# Patient Record
Sex: Female | Born: 1965 | Race: White | Hispanic: No | Marital: Single | Smoking: Never smoker
Health system: Southern US, Community
[De-identification: ages and names within clinical notes are randomized; demographics above are authoritative.]

## PROBLEM LIST (undated history)

## (undated) DIAGNOSIS — S83207A Unspecified tear of unspecified meniscus, current injury, left knee, initial encounter: Secondary | ICD-10-CM

## (undated) DIAGNOSIS — R2689 Other abnormalities of gait and mobility: Secondary | ICD-10-CM

## (undated) DIAGNOSIS — K219 Gastro-esophageal reflux disease without esophagitis: Secondary | ICD-10-CM

## (undated) DIAGNOSIS — K5792 Diverticulitis of intestine, part unspecified, without perforation or abscess without bleeding: Secondary | ICD-10-CM

## (undated) DIAGNOSIS — T4145XA Adverse effect of unspecified anesthetic, initial encounter: Secondary | ICD-10-CM

## (undated) DIAGNOSIS — Z8709 Personal history of other diseases of the respiratory system: Secondary | ICD-10-CM

## (undated) DIAGNOSIS — I10 Essential (primary) hypertension: Secondary | ICD-10-CM

## (undated) DIAGNOSIS — G5602 Carpal tunnel syndrome, left upper limb: Secondary | ICD-10-CM

## (undated) DIAGNOSIS — N182 Chronic kidney disease, stage 2 (mild): Secondary | ICD-10-CM

## (undated) DIAGNOSIS — E785 Hyperlipidemia, unspecified: Secondary | ICD-10-CM

## (undated) DIAGNOSIS — M1990 Unspecified osteoarthritis, unspecified site: Secondary | ICD-10-CM

## (undated) DIAGNOSIS — G43909 Migraine, unspecified, not intractable, without status migrainosus: Secondary | ICD-10-CM

## (undated) DIAGNOSIS — Z87442 Personal history of urinary calculi: Secondary | ICD-10-CM

## (undated) DIAGNOSIS — Z9889 Other specified postprocedural states: Secondary | ICD-10-CM

## (undated) DIAGNOSIS — Z86718 Personal history of other venous thrombosis and embolism: Secondary | ICD-10-CM

## (undated) DIAGNOSIS — R112 Nausea with vomiting, unspecified: Secondary | ICD-10-CM

## (undated) DIAGNOSIS — Z8719 Personal history of other diseases of the digestive system: Secondary | ICD-10-CM

## (undated) DIAGNOSIS — K439 Ventral hernia without obstruction or gangrene: Secondary | ICD-10-CM

## (undated) DIAGNOSIS — S83512A Sprain of anterior cruciate ligament of left knee, initial encounter: Secondary | ICD-10-CM

## (undated) HISTORY — PX: CARPAL TUNNEL RELEASE: SHX101

## (undated) HISTORY — PX: KNEE ARTHROSCOPY: SUR90

## (undated) HISTORY — PX: TUBAL LIGATION: SHX77

## (undated) HISTORY — PX: ESOPHAGOGASTRODUODENOSCOPY ENDOSCOPY: SHX5814

## (undated) HISTORY — DX: Hyperlipidemia, unspecified: E78.5

## (undated) HISTORY — PX: COLONOSCOPY: SHX174

## (undated) HISTORY — DX: Carpal tunnel syndrome, left upper limb: G56.02

## (undated) HISTORY — PX: ABDOMINOPLASTY: SUR9

## (undated) HISTORY — DX: Essential (primary) hypertension: I10

---
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
## 1995-06-26 HISTORY — PX: TUBAL LIGATION: SHX77

## 2006-07-13 ENCOUNTER — Ambulatory Visit: Payer: Self-pay

## 2006-08-22 ENCOUNTER — Ambulatory Visit: Payer: Self-pay | Admitting: Orthopaedic Surgery

## 2011-07-09 ENCOUNTER — Encounter (HOSPITAL_COMMUNITY): Payer: Self-pay | Admitting: Emergency Medicine

## 2011-07-09 ENCOUNTER — Emergency Department (HOSPITAL_COMMUNITY)
Admission: EM | Admit: 2011-07-09 | Discharge: 2011-07-09 | Disposition: A | Discharge status: Home / Self Care | Payer: BC Managed Care – PPO | Attending: Emergency Medicine | Admitting: Emergency Medicine

## 2011-07-09 ENCOUNTER — Emergency Department (HOSPITAL_COMMUNITY): Payer: BC Managed Care – PPO

## 2011-07-09 DIAGNOSIS — N200 Calculus of kidney: Secondary | ICD-10-CM | POA: Insufficient documentation

## 2011-07-09 DIAGNOSIS — K5732 Diverticulitis of large intestine without perforation or abscess without bleeding: Secondary | ICD-10-CM | POA: Insufficient documentation

## 2011-07-09 DIAGNOSIS — M545 Low back pain, unspecified: Secondary | ICD-10-CM | POA: Insufficient documentation

## 2011-07-09 DIAGNOSIS — R109 Unspecified abdominal pain: Secondary | ICD-10-CM | POA: Insufficient documentation

## 2011-07-09 DIAGNOSIS — K5792 Diverticulitis of intestine, part unspecified, without perforation or abscess without bleeding: Secondary | ICD-10-CM

## 2011-07-09 LAB — COMPREHENSIVE METABOLIC PANEL
ALT: 21 U/L (ref 0–35)
Albumin: 3.6 g/dL (ref 3.5–5.2)
Alkaline Phosphatase: 96 U/L (ref 39–117)
Glucose, Bld: 92 mg/dL (ref 70–99)
Potassium: 4 mEq/L (ref 3.5–5.1)
Sodium: 136 mEq/L (ref 135–145)
Total Protein: 7.8 g/dL (ref 6.0–8.3)

## 2011-07-09 LAB — DIFFERENTIAL
Basophils Relative: 0 % (ref 0–1)
Eosinophils Absolute: 0.2 10*3/uL (ref 0.0–0.7)
Lymphs Abs: 1.8 10*3/uL (ref 0.7–4.0)
Neutro Abs: 5.9 10*3/uL (ref 1.7–7.7)
Neutrophils Relative %: 68 % (ref 43–77)

## 2011-07-09 LAB — URINALYSIS, ROUTINE W REFLEX MICROSCOPIC
Bilirubin Urine: NEGATIVE
Leukocytes, UA: NEGATIVE
Nitrite: NEGATIVE
Specific Gravity, Urine: 1.026 (ref 1.005–1.030)
Urobilinogen, UA: 0.2 mg/dL (ref 0.0–1.0)

## 2011-07-09 LAB — CBC
MCH: 32.1 pg (ref 26.0–34.0)
Platelets: 236 10*3/uL (ref 150–400)
RBC: 4.43 MIL/uL (ref 3.87–5.11)
WBC: 8.7 10*3/uL (ref 4.0–10.5)

## 2011-07-09 LAB — WET PREP, GENITAL: Trich, Wet Prep: NONE SEEN

## 2011-07-09 LAB — URINE MICROSCOPIC-ADD ON

## 2011-07-09 LAB — PREGNANCY, URINE: Preg Test, Ur: NEGATIVE

## 2011-07-09 MED ORDER — METRONIDAZOLE 500 MG PO TABS: 500.0000 mg | ORAL_TABLET | Freq: Three times a day (TID) | ORAL | Status: AC

## 2011-07-09 MED ORDER — CIPROFLOXACIN HCL 500 MG PO TABS
500.0000 mg | ORAL_TABLET | Freq: Once | ORAL | Status: AC
  Administered 2011-07-09: 500 mg via ORAL
  Filled 2011-07-09: qty 1

## 2011-07-09 MED ORDER — CIPROFLOXACIN HCL 500 MG PO TABS: 500.0000 mg | ORAL_TABLET | Freq: Two times a day (BID) | ORAL | Status: AC

## 2011-07-09 MED ORDER — ONDANSETRON HCL 4 MG/2ML IJ SOLN
INTRAMUSCULAR | Status: AC
  Filled 2011-07-09: qty 2

## 2011-07-09 MED ORDER — IOHEXOL 300 MG/ML  SOLN
80.0000 mL | Freq: Once | INTRAMUSCULAR | Status: AC | PRN
  Administered 2011-07-09: 80 mL via INTRAVENOUS

## 2011-07-09 MED ORDER — MORPHINE SULFATE 4 MG/ML IJ SOLN
6.0000 mg | Freq: Once | INTRAMUSCULAR | Status: AC
  Administered 2011-07-09: 6 mg via INTRAVENOUS
  Filled 2011-07-09: qty 1
  Filled 2011-07-09: qty 2

## 2011-07-09 MED ORDER — MORPHINE SULFATE 4 MG/ML IJ SOLN
4.0000 mg | Freq: Once | INTRAMUSCULAR | Status: AC
  Administered 2011-07-09: 4 mg via INTRAVENOUS
  Filled 2011-07-09: qty 1

## 2011-07-09 MED ORDER — ONDANSETRON HCL 4 MG/2ML IJ SOLN
4.0000 mg | Freq: Once | INTRAMUSCULAR | Status: AC
  Administered 2011-07-09: 4 mg via INTRAVENOUS

## 2011-07-09 MED ORDER — HYDROCODONE-ACETAMINOPHEN 5-500 MG PO TABS: 1.0000 | ORAL_TABLET | Freq: Four times a day (QID) | ORAL | Status: AC | PRN

## 2011-07-09 MED ORDER — METRONIDAZOLE 500 MG PO TABS
500.0000 mg | ORAL_TABLET | Freq: Once | ORAL | Status: AC
  Administered 2011-07-09: 500 mg via ORAL
  Filled 2011-07-09: qty 1

## 2011-07-09 NOTE — ED Provider Notes (Signed)
History     CSN: [id number]  Arrival date & time 07/09/11  4098   First MD Initiated Contact with Patient 07/09/11 0845      Chief Complaint  Patient presents with  . Abdominal Pain    (Consider location/radiation/quality/duration/timing/severity/associated sxs/prior treatment) Patient is a 46 y.o. female presenting with abdominal pain. The history is provided by the patient.  Abdominal Pain The primary symptoms of the illness include abdominal pain. The primary symptoms of the illness do not include fever, shortness of breath, nausea, vomiting, diarrhea, dysuria, vaginal discharge or vaginal bleeding. The current episode started 2 days ago. The onset of the illness was gradual. The problem has not changed since onset. The patient states that she believes she is currently not pregnant. The patient has not had a change in bowel habit. Symptoms associated with the illness do not include chills, anorexia, heartburn, constipation, urgency, hematuria, frequency or back pain.  Pt states pain is mainly in the right lower abdomen but radiates all over and to the lower back. Denies fever, chills, nausea, vomiting, vaginal discharge, bleeding. Normal urination and bowel movements, last was yesterday. Good appetite.   History reviewed. No pertinent past medical history.  History reviewed. No pertinent past surgical history.  No family history on file.  History  Substance Use Topics  . Smoking status: Never Smoker   . Smokeless tobacco: Not on file  . Alcohol Use: Yes    OB History    Grav Para Term Preterm Abortions TAB SAB Ect Mult Living                  Review of Systems  Constitutional: Negative for fever and chills.  HENT: Negative.   Eyes: Negative.   Respiratory: Negative for shortness of breath.   Gastrointestinal: Positive for abdominal pain. Negative for heartburn, nausea, vomiting, diarrhea, constipation and anorexia.  Genitourinary: Negative for dysuria, urgency,  frequency, hematuria, vaginal bleeding and vaginal discharge.  Musculoskeletal: Negative for back pain.  Neurological: Negative.   Psychiatric/Behavioral: Negative.     Allergies  Review of patient's allergies indicates no known allergies.  Home Medications  No current outpatient prescriptions on file.  BP 148/72  Pulse 88  Temp(Src) 98.3 F (36.8 C) (Oral)  Resp 18  SpO2 100%  Physical Exam  Nursing note and vitals reviewed. Constitutional: She is oriented to person, place, and time. She appears well-developed and well-nourished.  HENT:  Head: Normocephalic.  Neck: Neck supple.  Cardiovascular: Normal rate, regular rhythm and normal heart sounds.   Pulmonary/Chest: Effort normal and breath sounds normal. No respiratory distress.  Abdominal: Soft. Bowel sounds are normal. She exhibits no distension. There is tenderness. There is no rebound.       RLQ tenderness, guarding, no rebound tenderness.   Genitourinary:       Normal vaginal canal with no discharge, normal appearing cervix. No CMT, no adnexal tenderness or masses. No CVA tenderness  Musculoskeletal: Normal range of motion.  Neurological: She is alert and oriented to person, place, and time.  Skin: Skin is warm and dry.  Psychiatric: She has a normal mood and affect.    ED Course  Procedures (including critical care time)  Pt with RLQ abdominal pain. Unremarkable pelvic exam, no adnexal tenderness or masses. Unremarkable labs. Will get CT abd pelvis for further evaluation.  Results for orders placed during the hospital encounter of 07/09/11  URINALYSIS, ROUTINE W REFLEX MICROSCOPIC      Component Value Range   Color, Urine  YELLOW  YELLOW    APPearance CLEAR  CLEAR    Specific Gravity, Urine 1.026  1.005 - 1.030    pH 6.5  5.0 - 8.0    Glucose, UA NEGATIVE  NEGATIVE (mg/dL)   Hgb urine dipstick NEGATIVE  NEGATIVE    Bilirubin Urine NEGATIVE  NEGATIVE    Ketones, ur NEGATIVE  NEGATIVE (mg/dL)   Protein, ur 30  (*) NEGATIVE (mg/dL)   Urobilinogen, UA 0.2  0.0 - 1.0 (mg/dL)   Nitrite NEGATIVE  NEGATIVE    Leukocytes, UA NEGATIVE  NEGATIVE   CBC      Component Value Range   WBC 8.7  4.0 - 10.5 (K/uL)   RBC 4.43  3.87 - 5.11 (MIL/uL)   Hemoglobin 14.2  12.0 - 15.0 (g/dL)   HCT 16.1  09.6 - 04.5 (%)   MCV 90.7  78.0 - 100.0 (fL)   MCH 32.1  26.0 - 34.0 (pg)   MCHC 35.3  30.0 - 36.0 (g/dL)   RDW 40.9  81.1 - 91.4 (%)   Platelets 236  150 - 400 (K/uL)  DIFFERENTIAL      Component Value Range   Neutrophils Relative 68  43 - 77 (%)   Neutro Abs 5.9  1.7 - 7.7 (K/uL)   Lymphocytes Relative 21  12 - 46 (%)   Lymphs Abs 1.8  0.7 - 4.0 (K/uL)   Monocytes Relative 9  3 - 12 (%)   Monocytes Absolute 0.8  0.1 - 1.0 (K/uL)   Eosinophils Relative 2  0 - 5 (%)   Eosinophils Absolute 0.2  0.0 - 0.7 (K/uL)   Basophils Relative 0  0 - 1 (%)   Basophils Absolute 0.0  0.0 - 0.1 (K/uL)  COMPREHENSIVE METABOLIC PANEL      Component Value Range   Sodium 136  135 - 145 (mEq/L)   Potassium 4.0  3.5 - 5.1 (mEq/L)   Chloride 102  96 - 112 (mEq/L)   CO2 23  19 - 32 (mEq/L)   Glucose, Bld 92  70 - 99 (mg/dL)   BUN 9  6 - 23 (mg/dL)   Creatinine, Ser 7.82  0.50 - 1.10 (mg/dL)   Calcium 9.3  8.4 - 95.6 (mg/dL)   Total Protein 7.8  6.0 - 8.3 (g/dL)   Albumin 3.6  3.5 - 5.2 (g/dL)   AST 18  0 - 37 (U/L)   ALT 21  0 - 35 (U/L)   Alkaline Phosphatase 96  39 - 117 (U/L)   Total Bilirubin 0.6  0.3 - 1.2 (mg/dL)   GFR calc non Af Amer 82 (*) >90 (mL/min)   GFR calc Af Amer >90  >90 (mL/min)  WET PREP, GENITAL      Component Value Range   Yeast, Wet Prep NONE SEEN  NONE SEEN    Trich, Wet Prep NONE SEEN  NONE SEEN    Clue Cells, Wet Prep RARE (*) NONE SEEN    WBC, Wet Prep HPF POC RARE (*) NONE SEEN   PREGNANCY, URINE      Component Value Range   Preg Test, Ur NEGATIVE    URINE MICROSCOPIC-ADD ON      Component Value Range   Squamous Epithelial / LPF FEW (*) RARE    WBC, UA 0-2  <3 (WBC/hpf)   Bacteria, UA  RARE  RARE    Urine-Other MUCOUS PRESENT     Ct Abdomen Pelvis W Contrast  07/09/2011  *RADIOLOGY REPORT*  Clinical Data:  Abdominal pain.  History kidney stones.  CT ABDOMEN AND PELVIS WITH CONTRAST  Technique:  Multidetector CT imaging of the abdomen and pelvis was performed following the standard protocol during bolus administration of intravenous contrast.  Contrast: 80mL OMNIPAQUE IOHEXOL 300 MG/ML IV SOLN  Comparison: None.  Findings: The patient has diverticulitis of the distal sigmoid portion of the colon deep in the right side of the pelvis posterior to the right ovary and the uterus.  There is pericolonic soft tissue inflammation with the edema of the mucosa of the colon.  No abscess or free air.  Mild hepatomegaly consistent with hepatic steatosis.  Spleen, pancreas, right adrenal gland, and kidneys are normal. 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  No adenopathy.  Terminal ileum and appendix are normal.  Scattered diverticula throughout the colon, most numerous in the sigmoid region.  No significant osseous abnormality.  IMPRESSION:  1.  Acute diverticulitis of the distal sigmoid colon low in the right side of the pelvis with no abscess or perforation. 2.  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  Original Report Authenticated By: Gwynn Burly, M.D.    Pt has normal vs. Diverticulitis on CT abd.  Labs normal. Will treat outpatient. First set of antibiotics given in ED. VS stable during the visit. Pt agrees with the plan.  MDM          Lottie Mussel, PA 07/09/11 1504

## 2011-07-09 NOTE — ED Notes (Signed)
Pt has had tubal ligation  

## 2011-07-09 NOTE — ED Notes (Signed)
Rt lower abd pain x 36 hours that goes thru to back hx of kidney stones

## 2011-07-09 NOTE — ED Notes (Signed)
Pt undressed and in gown. Pulse ox and bp cuff on.

## 2011-07-10 NOTE — ED Provider Notes (Signed)
Medical screening examination/treatment/procedure(s) were performed by non-physician practitioner and as supervising physician I was immediately available for consultation/collaboration.    Allysson Rinehimer R Demarr Kluever, MD 07/10/11 0706 

## 2013-04-20 ENCOUNTER — Emergency Department (HOSPITAL_COMMUNITY)
Admission: EM | Admit: 2013-04-20 | Discharge: 2013-04-20 | Disposition: A | Discharge status: Home / Self Care | Payer: BC Managed Care – PPO | Attending: Emergency Medicine | Admitting: Emergency Medicine

## 2013-04-20 ENCOUNTER — Emergency Department (HOSPITAL_COMMUNITY): Payer: BC Managed Care – PPO

## 2013-04-20 ENCOUNTER — Encounter (HOSPITAL_COMMUNITY): Payer: Self-pay | Admitting: Emergency Medicine

## 2013-04-20 DIAGNOSIS — Z3202 Encounter for pregnancy test, result negative: Secondary | ICD-10-CM | POA: Insufficient documentation

## 2013-04-20 DIAGNOSIS — Z79899 Other long term (current) drug therapy: Secondary | ICD-10-CM | POA: Insufficient documentation

## 2013-04-20 DIAGNOSIS — R1032 Left lower quadrant pain: Secondary | ICD-10-CM | POA: Insufficient documentation

## 2013-04-20 DIAGNOSIS — Z87442 Personal history of urinary calculi: Secondary | ICD-10-CM | POA: Insufficient documentation

## 2013-04-20 DIAGNOSIS — M545 Low back pain, unspecified: Secondary | ICD-10-CM | POA: Insufficient documentation

## 2013-04-20 DIAGNOSIS — R109 Unspecified abdominal pain: Secondary | ICD-10-CM

## 2013-04-20 DIAGNOSIS — Z8719 Personal history of other diseases of the digestive system: Secondary | ICD-10-CM | POA: Insufficient documentation

## 2013-04-20 DIAGNOSIS — R112 Nausea with vomiting, unspecified: Secondary | ICD-10-CM | POA: Insufficient documentation

## 2013-04-20 HISTORY — DX: Diverticulitis of intestine, part unspecified, without perforation or abscess without bleeding: K57.92

## 2013-04-20 LAB — COMPREHENSIVE METABOLIC PANEL WITH GFR
ALT: 27 U/L (ref 0–35)
AST: 23 U/L (ref 0–37)
Albumin: 4.1 g/dL (ref 3.5–5.2)
Alkaline Phosphatase: 109 U/L (ref 39–117)
BUN: 14 mg/dL (ref 6–23)
CO2: 23 meq/L (ref 19–32)
Calcium: 9.4 mg/dL (ref 8.4–10.5)
Chloride: 102 meq/L (ref 96–112)
Creatinine, Ser: 0.81 mg/dL (ref 0.50–1.10)
GFR calc Af Amer: >90 mL/min
GFR calc non Af Amer: 86 mL/min — ABNORMAL LOW
Glucose, Bld: 98 mg/dL (ref 70–99)
Potassium: 3.8 meq/L (ref 3.5–5.1)
Sodium: 137 meq/L (ref 135–145)
Total Bilirubin: 0.3 mg/dL (ref 0.3–1.2)
Total Protein: 7.8 g/dL (ref 6.0–8.3)

## 2013-04-20 LAB — URINALYSIS, ROUTINE W REFLEX MICROSCOPIC
Bilirubin Urine: NEGATIVE
Glucose, UA: NEGATIVE mg/dL
Hgb urine dipstick: NEGATIVE
Ketones, ur: NEGATIVE mg/dL
Leukocytes, UA: NEGATIVE
Nitrite: NEGATIVE
Protein, ur: NEGATIVE mg/dL
Specific Gravity, Urine: 1.019 (ref 1.005–1.030)
Urobilinogen, UA: 0.2 mg/dL (ref 0.0–1.0)
pH: 6 (ref 5.0–8.0)

## 2013-04-20 LAB — CBC WITH DIFFERENTIAL/PLATELET
Basophils Absolute: 0 10*3/uL (ref 0.0–0.1)
Basophils Relative: 1 % (ref 0–1)
Eosinophils Relative: 3 % (ref 0–5)
HCT: 41.3 % (ref 36.0–46.0)
MCHC: 35.1 g/dL (ref 30.0–36.0)
MCV: 93.7 fL (ref 78.0–100.0)
Monocytes Absolute: 0.6 10*3/uL (ref 0.1–1.0)
RDW: 12.2 % (ref 11.5–15.5)

## 2013-04-20 LAB — PREGNANCY, URINE: Preg Test, Ur: NEGATIVE

## 2013-04-20 MED ORDER — ONDANSETRON 4 MG PO TBDP: 4.0000 mg | ORAL_TABLET | Freq: Three times a day (TID) | ORAL | Status: DC | PRN

## 2013-04-20 MED ORDER — HYDROCODONE-ACETAMINOPHEN 5-325 MG PO TABS: 1.0000 | ORAL_TABLET | Freq: Four times a day (QID) | ORAL | Status: DC | PRN

## 2013-04-20 MED ORDER — MORPHINE SULFATE 4 MG/ML IJ SOLN
4.0000 mg | Freq: Once | INTRAMUSCULAR | Status: AC
  Administered 2013-04-20: 4 mg via INTRAVENOUS
  Filled 2013-04-20: qty 1

## 2013-04-20 MED ORDER — ONDANSETRON HCL 4 MG/2ML IJ SOLN
4.0000 mg | Freq: Once | INTRAMUSCULAR | Status: AC
  Administered 2013-04-20: 4 mg via INTRAVENOUS
  Filled 2013-04-20: qty 2

## 2013-04-20 MED ORDER — SODIUM CHLORIDE 0.9 % IV BOLUS (SEPSIS)
1000.0000 mL | Freq: Once | INTRAVENOUS | Status: AC
  Administered 2013-04-20: 1000 mL via INTRAVENOUS

## 2013-04-20 NOTE — ED Notes (Signed)
Patient states that she is having abdominal pain on L and R side.  Patient states it hurts more on the L lower abdomen.   Patient states she has been having pain x 1 week.   Patient states also has low back pain all the way across.   Patient states "I think it may be my diverticulitis".   Patient denies any problems with bowel movements.   Patient states that she is having urinary frequency.   Patient advised she has had some nausea and vomiting but that it generally coincides with her pain.

## 2013-04-20 NOTE — ED Provider Notes (Signed)
CSN: [id number]     Arrival date & time 04/20/13  1026 History   First MD Initiated Contact with Patient 04/20/13 1041     Chief Complaint  Patient presents with  . Abdominal Pain  . Back Pain   (Consider location/radiation/quality/duration/timing/severity/associated sxs/prior Treatment) HPI Comments: Patient is a 47 yo F PMHx significant for diverticulosis, h/o kidney stones presenting to the ED for one week of bilateral flank pain with radiation into lower bilateral abdomen. Patient describes pain as intermittent sharp. Patient rates her pain 8/10, stating laying still alleviates her pain while movement exacerbates her pain. Patient states she has not tried any OTC medications for her pain. She does report associated nausea and 1-2 episodes of non-bloody non-bilious emesis with pain. She denies fevers, diarrhea, urinary symptoms, vaginal discharge or bleeding. LMP 3 weeks ago. Abdominal/pelvic surgical history includes tubal ligation.   Patient is a 47 y.o. female presenting with abdominal pain and back pain.  Abdominal Pain Associated symptoms: nausea and vomiting   Associated symptoms: no chest pain, no dysuria, no fever, no hematuria, no shortness of breath, no vaginal bleeding and no vaginal discharge   Back Pain Associated symptoms: abdominal pain   Associated symptoms: no chest pain, no dysuria, no fever and no headaches     Past Medical History  Diagnosis Date  . Kidney stones   . Diverticulitis    Past Surgical History  Procedure Laterality Date  . Tubal ligation    . Knee arthroscopy     No family history on file. History  Substance Use Topics  . Smoking status: Never Smoker   . Smokeless tobacco: Not on file  . Alcohol Use: Yes   OB History   Grav Para Term Preterm Abortions TAB SAB Ect Mult Living                 Review of Systems  Constitutional: Negative for fever.  Respiratory: Negative for shortness of breath.   Cardiovascular: Negative for chest pain.   Gastrointestinal: Positive for nausea, vomiting and abdominal pain.  Genitourinary: Positive for flank pain. Negative for dysuria, urgency, frequency, hematuria, decreased urine volume, vaginal bleeding, vaginal discharge and menstrual problem.  Musculoskeletal: Positive for back pain.  Neurological: Negative for headaches.  All other systems reviewed and are negative.    Allergies  Review of patient's allergies indicates no known allergies.  Home Medications   Current Outpatient Rx  Name  Route  Sig  Dispense  Refill  . buPROPion (WELLBUTRIN SR) 150 MG 12 hr tablet   Oral   Take 150 mg by mouth daily.         Marland Kitchen escitalopram (LEXAPRO) 10 MG tablet   Oral   Take 10 mg by mouth daily.         Marland Kitchen losartan-hydrochlorothiazide (HYZAAR) 100-12.5 MG per tablet   Oral   Take 1 tablet by mouth daily.         Marland Kitchen HYDROcodone-acetaminophen (NORCO/VICODIN) 5-325 MG per tablet   Oral   Take 1-2 tablets by mouth every 6 (six) hours as needed for pain.   12 tablet   0   . ondansetron (ZOFRAN ODT) 4 MG disintegrating tablet   Oral   Take 1 tablet (4 mg total) by mouth every 8 (eight) hours as needed for nausea.   10 tablet   0    BP 100/78  Pulse 55  Temp(Src) 97.6 F (36.4 C) (Oral)  Resp 20  Ht 5\' 4"  (1.626 m)  Wt  165 lb (74.844 kg)  BMI 28.31 kg/m2  SpO2 98%  LMP 03/30/2013 Physical Exam  Constitutional: She is oriented to person, place, and time. She appears well-developed and well-nourished. No distress.  HENT:  Head: Normocephalic and atraumatic.  Right Ear: External ear normal.  Left Ear: External ear normal.  Nose: Nose normal.  Mouth/Throat: Oropharynx is clear and moist.  Eyes: Conjunctivae are normal.  Neck: Neck supple.  Cardiovascular: Normal rate, regular rhythm and normal heart sounds.   Pulmonary/Chest: Effort normal and breath sounds normal. No respiratory distress. She exhibits no tenderness.  Abdominal: Soft. Bowel sounds are normal. There is  tenderness in the right lower quadrant, suprapubic area and left lower quadrant. There is CVA tenderness. There is no rigidity, no rebound and no guarding.  Musculoskeletal: Normal range of motion. She exhibits no tenderness.  Neurological: She is alert and oriented to person, place, and time.  Skin: Skin is warm and dry. She is not diaphoretic.    ED Course  Procedures (including critical care time) Medications  sodium chloride 0.9 % bolus 1,000 mL (0 mLs Intravenous Stopped 04/20/13 1315)  morphine 4 MG/ML injection 4 mg (4 mg Intravenous Given 04/20/13 1153)  ondansetron (ZOFRAN) injection 4 mg (4 mg Intravenous Given 04/20/13 1151)  morphine 4 MG/ML injection 4 mg (4 mg Intravenous Given 04/20/13 1333)    Labs Review Labs Reviewed  COMPREHENSIVE METABOLIC PANEL - Abnormal; Notable for the following:    GFR calc non Af Amer 86 (*)    All other components within normal limits  CBC WITH DIFFERENTIAL  URINALYSIS, ROUTINE W REFLEX MICROSCOPIC  PREGNANCY, URINE   Imaging Review Ct Abdomen Pelvis Wo Contrast  04/20/2013   CLINICAL DATA:  Lower abdominal pain  EXAM: CT ABDOMEN AND PELVIS WITHOUT CONTRAST  TECHNIQUE: Multidetector CT imaging of the abdomen and pelvis was performed following the standard protocol without intravenous contrast.  COMPARISON:  07/09/2011  FINDINGS: The lung bases are free of acute infiltrate or sizable effusion. Bilateral breast implants are noted.  The liver is diffusely fatty infiltrated. A few small focal areas of focal fatty sparing are noted adjacent to the gallbladder fossa. The gallbladder, spleen, adrenal glands and pancreas are within normal limits with the exception of an exophytic fatty lesion arising from the left adrenal gland. This would be most consistent with a myelolipoma.  The kidneys are well visualized bilaterally without evidence of renal calculi or obstructive changes. The appendix is within normal limits. Diverticular change of the colon is  seen without definitive diverticulitis. No acute bony abnormality is noted.  IMPRESSION: Stable changes from previous exam. No acute abnormality is noted.   Electronically Signed   By: Alcide Clever M.D.   On: 04/20/2013 13:16    EKG Interpretation   None       MDM   1. Abdominal  pain, other specified site    Afebrile, NAD, non-toxic appearing, AAOx4. Patient is nontoxic, nonseptic appearing, in no apparent distress.  Patient's pain and other symptoms adequately managed in emergency department.  Fluid bolus given.  Labs, imaging and vitals reviewed.  Patient does not meet the SIRS or Sepsis criteria.  On repeat exam patient does not have a surgical abdomen and there are nor peritoneal signs.  No indication of diverticulitis, appendicitis, bowel obstruction, bowel perforation, cholecystitis, diverticulitis, PID or ectopic pregnancy. CT scan reviewed, noting colon is without definitive diverticulitis. Patient discharged home with symptomatic treatment and given strict instructions for follow-up with their primary care physician.  Will also have patient follow up with GI. I have also discussed reasons to return immediately to the ER.  Patient expresses understanding and agrees with plan. Patient d/w with Dr. Deretha Emory, agrees with plan. Patient is stable at time of discharge          Jeannetta Ellis, PA-C 04/20/13 1539

## 2013-04-20 NOTE — ED Notes (Signed)
Pt alert and mentating appropriately upon d/c. Pt given d/c teaching, prescriptions and follow up care instructions. Pt verbalizes understanding and has no further questions upon d/c. Pt ambulatory leaving ER with significant other and steady gait. Pt instructed not to drive. Pt significant other at bedside endorses pt will not driven home.

## 2013-04-21 NOTE — ED Provider Notes (Signed)
Medical screening examination/treatment/procedure(s) were performed by non-physician practitioner and as supervising physician I was immediately available for consultation/collaboration.  EKG Interpretation   None         Chanda Laperle W. Heatherly Stenner, MD 04/21/13 0718 

## 2017-05-03 ENCOUNTER — Ambulatory Visit
Admission: RE | Admit: 2017-05-03 | Discharge: 2017-05-03 | Disposition: A | Discharge status: Home / Self Care | Payer: Disability Insurance | Source: Ambulatory Visit | Attending: Thoracic Surgery | Admitting: Thoracic Surgery

## 2017-05-03 ENCOUNTER — Other Ambulatory Visit: Payer: Self-pay | Admitting: Thoracic Surgery

## 2017-05-03 DIAGNOSIS — R52 Pain, unspecified: Secondary | ICD-10-CM | POA: Diagnosis not present

## 2017-06-24 ENCOUNTER — Other Ambulatory Visit: Payer: Self-pay | Admitting: Thoracic Surgery

## 2017-06-24 ENCOUNTER — Ambulatory Visit
Admission: RE | Admit: 2017-06-24 | Discharge: 2017-06-24 | Disposition: A | Discharge status: Home / Self Care | Payer: Disability Insurance | Source: Ambulatory Visit | Attending: Thoracic Surgery | Admitting: Thoracic Surgery

## 2017-06-24 DIAGNOSIS — S3992XA Unspecified injury of lower back, initial encounter: Secondary | ICD-10-CM

## 2017-06-24 DIAGNOSIS — M47817 Spondylosis without myelopathy or radiculopathy, lumbosacral region: Secondary | ICD-10-CM | POA: Diagnosis not present

## 2017-06-24 DIAGNOSIS — F439 Reaction to severe stress, unspecified: Secondary | ICD-10-CM | POA: Diagnosis not present

## 2017-06-24 DIAGNOSIS — R51 Headache: Secondary | ICD-10-CM | POA: Insufficient documentation

## 2017-06-24 DIAGNOSIS — S8990XA Unspecified injury of unspecified lower leg, initial encounter: Secondary | ICD-10-CM | POA: Insufficient documentation

## 2017-06-24 DIAGNOSIS — T1490XA Injury, unspecified, initial encounter: Secondary | ICD-10-CM | POA: Insufficient documentation

## 2017-06-24 DIAGNOSIS — X58XXXA Exposure to other specified factors, initial encounter: Secondary | ICD-10-CM | POA: Insufficient documentation

## 2017-06-24 DIAGNOSIS — M4316 Spondylolisthesis, lumbar region: Secondary | ICD-10-CM | POA: Insufficient documentation

## 2017-06-24 DIAGNOSIS — M5136 Other intervertebral disc degeneration, lumbar region: Secondary | ICD-10-CM | POA: Diagnosis not present

## 2017-06-24 DIAGNOSIS — F419 Anxiety disorder, unspecified: Secondary | ICD-10-CM | POA: Diagnosis not present

## 2018-01-23 ENCOUNTER — Emergency Department: Payer: Medicaid Other

## 2018-01-23 ENCOUNTER — Inpatient Hospital Stay
Admission: EM | Admit: 2018-01-23 | Discharge: 2018-01-26 | DRG: 917 | Disposition: A | Discharge status: Home / Self Care | Payer: Medicaid Other | Attending: Internal Medicine | Admitting: Internal Medicine

## 2018-01-23 DIAGNOSIS — E860 Dehydration: Secondary | ICD-10-CM | POA: Diagnosis present

## 2018-01-23 DIAGNOSIS — J9602 Acute respiratory failure with hypercapnia: Secondary | ICD-10-CM | POA: Diagnosis present

## 2018-01-23 DIAGNOSIS — M6282 Rhabdomyolysis: Secondary | ICD-10-CM | POA: Diagnosis present

## 2018-01-23 DIAGNOSIS — F1410 Cocaine abuse, uncomplicated: Secondary | ICD-10-CM

## 2018-01-23 DIAGNOSIS — T43011A Poisoning by tricyclic antidepressants, accidental (unintentional), initial encounter: Principal | ICD-10-CM | POA: Diagnosis present

## 2018-01-23 DIAGNOSIS — I10 Essential (primary) hypertension: Secondary | ICD-10-CM | POA: Diagnosis present

## 2018-01-23 DIAGNOSIS — F1490 Cocaine use, unspecified, uncomplicated: Secondary | ICD-10-CM

## 2018-01-23 DIAGNOSIS — R4020 Unspecified coma: Secondary | ICD-10-CM

## 2018-01-23 DIAGNOSIS — E872 Acidosis: Secondary | ICD-10-CM | POA: Diagnosis present

## 2018-01-23 DIAGNOSIS — N170 Acute kidney failure with tubular necrosis: Secondary | ICD-10-CM | POA: Diagnosis present

## 2018-01-23 DIAGNOSIS — Z79899 Other long term (current) drug therapy: Secondary | ICD-10-CM

## 2018-01-23 DIAGNOSIS — I82622 Acute embolism and thrombosis of deep veins of left upper extremity: Secondary | ICD-10-CM | POA: Diagnosis present

## 2018-01-23 DIAGNOSIS — T428X1A Poisoning by antiparkinsonism drugs and other central muscle-tone depressants, accidental (unintentional), initial encounter: Secondary | ICD-10-CM | POA: Diagnosis present

## 2018-01-23 DIAGNOSIS — T50904A Poisoning by unspecified drugs, medicaments and biological substances, undetermined, initial encounter: Secondary | ICD-10-CM

## 2018-01-23 DIAGNOSIS — F419 Anxiety disorder, unspecified: Secondary | ICD-10-CM | POA: Diagnosis present

## 2018-01-23 DIAGNOSIS — M549 Dorsalgia, unspecified: Secondary | ICD-10-CM | POA: Diagnosis present

## 2018-01-23 DIAGNOSIS — R609 Edema, unspecified: Secondary | ICD-10-CM

## 2018-01-23 DIAGNOSIS — R4182 Altered mental status, unspecified: Secondary | ICD-10-CM | POA: Diagnosis present

## 2018-01-23 DIAGNOSIS — G8929 Other chronic pain: Secondary | ICD-10-CM | POA: Diagnosis present

## 2018-01-23 DIAGNOSIS — E876 Hypokalemia: Secondary | ICD-10-CM | POA: Diagnosis present

## 2018-01-23 DIAGNOSIS — Z4659 Encounter for fitting and adjustment of other gastrointestinal appliance and device: Secondary | ICD-10-CM

## 2018-01-23 DIAGNOSIS — R4781 Slurred speech: Secondary | ICD-10-CM | POA: Diagnosis present

## 2018-01-23 DIAGNOSIS — J9601 Acute respiratory failure with hypoxia: Secondary | ICD-10-CM | POA: Diagnosis present

## 2018-01-23 DIAGNOSIS — N179 Acute kidney failure, unspecified: Secondary | ICD-10-CM

## 2018-01-23 DIAGNOSIS — Z5181 Encounter for therapeutic drug level monitoring: Secondary | ICD-10-CM | POA: Diagnosis not present

## 2018-01-23 DIAGNOSIS — T405X1A Poisoning by cocaine, accidental (unintentional), initial encounter: Secondary | ICD-10-CM | POA: Diagnosis present

## 2018-01-23 DIAGNOSIS — R410 Disorientation, unspecified: Secondary | ICD-10-CM

## 2018-01-23 DIAGNOSIS — G92 Toxic encephalopathy: Secondary | ICD-10-CM | POA: Diagnosis present

## 2018-01-23 DIAGNOSIS — T43221A Poisoning by selective serotonin reuptake inhibitors, accidental (unintentional), initial encounter: Secondary | ICD-10-CM | POA: Diagnosis present

## 2018-01-23 LAB — COMPREHENSIVE METABOLIC PANEL
ALT: 80 U/L — AB (ref 0–44)
ANION GAP: 16 — AB (ref 5–15)
AST: 139 U/L — ABNORMAL HIGH (ref 15–41)
Albumin: 4.3 g/dL (ref 3.5–5.0)
Alkaline Phosphatase: 83 U/L (ref 38–126)
BUN: 51 mg/dL — AB (ref 6–20)
CALCIUM: 8.8 mg/dL — AB (ref 8.9–10.3)
CO2: 21 mmol/L — AB (ref 22–32)
Chloride: 100 mmol/L (ref 98–111)
Creatinine, Ser: 2.27 mg/dL — ABNORMAL HIGH (ref 0.44–1.00)
GFR calc Af Amer: 28 mL/min — ABNORMAL LOW (ref >60)
GFR calc non Af Amer: 24 mL/min — ABNORMAL LOW (ref >60)
Glucose, Bld: 132 mg/dL — ABNORMAL HIGH (ref 70–99)
Potassium: 4.3 mmol/L (ref 3.5–5.1)
SODIUM: 137 mmol/L (ref 135–145)
Total Bilirubin: 1.5 mg/dL — ABNORMAL HIGH (ref 0.3–1.2)
Total Protein: 7.5 g/dL (ref 6.5–8.1)

## 2018-01-23 LAB — URINALYSIS, COMPLETE (UACMP) WITH MICROSCOPIC
Bilirubin Urine: NEGATIVE
Glucose, UA: NEGATIVE mg/dL
Ketones, ur: 20 mg/dL — AB
LEUKOCYTES UA: NEGATIVE
Nitrite: NEGATIVE
PROTEIN: 30 mg/dL — AB
Specific Gravity, Urine: 1.016 (ref 1.005–1.030)
pH: 5 (ref 5.0–8.0)

## 2018-01-23 LAB — CBC WITH DIFFERENTIAL/PLATELET
BASOS ABS: 0 10*3/uL (ref 0–0.1)
Basophils Relative: 0 %
EOS ABS: 0 10*3/uL (ref 0–0.7)
Eosinophils Relative: 0 %
HEMATOCRIT: 34 % — AB (ref 35.0–47.0)
Hemoglobin: 11.9 g/dL — ABNORMAL LOW (ref 12.0–16.0)
Lymphocytes Relative: 9 %
Lymphs Abs: 0.6 10*3/uL — ABNORMAL LOW (ref 1.0–3.6)
MCH: 33.7 pg (ref 26.0–34.0)
MCHC: 35.1 g/dL (ref 32.0–36.0)
MCV: 96.1 fL (ref 80.0–100.0)
MONOS PCT: 6 %
Monocytes Absolute: 0.5 10*3/uL (ref 0.2–0.9)
NEUTROS ABS: 6.1 10*3/uL (ref 1.4–6.5)
Neutrophils Relative %: 85 %
Platelets: 178 10*3/uL (ref 150–440)
RBC: 3.54 MIL/uL — AB (ref 3.80–5.20)
RDW: 14.1 % (ref 11.5–14.5)
WBC: 7.1 10*3/uL (ref 3.6–11.0)

## 2018-01-23 LAB — URINE DRUG SCREEN, QUALITATIVE (ARMC ONLY)
Amphetamines, Ur Screen: NOT DETECTED
Barbiturates, Ur Screen: NOT DETECTED
Cannabinoid 50 Ng, Ur ~~LOC~~: NOT DETECTED
Cocaine Metabolite,Ur ~~LOC~~: POSITIVE — AB
MDMA (Ecstasy)Ur Screen: NOT DETECTED
METHADONE SCREEN, URINE: NOT DETECTED
Opiate, Ur Screen: NOT DETECTED
Phencyclidine (PCP) Ur S: NOT DETECTED
Tricyclic, Ur Screen: POSITIVE — AB

## 2018-01-23 LAB — BLOOD GAS, ARTERIAL
Acid-base deficit: 2.7 mmol/L — ABNORMAL HIGH (ref 0.0–2.0)
BICARBONATE: 19.5 mmol/L — AB (ref 20.0–28.0)
FIO2: 0.4
MECHVT: 430 mL
Mechanical Rate: 18
O2 Saturation: 99.4 %
PATIENT TEMPERATURE: 37
PEEP: 5 cmH2O
PH ART: 7.45 (ref 7.350–7.450)
PO2 ART: 153 mmHg — AB (ref 83.0–108.0)
pCO2 arterial: 28 mmHg — ABNORMAL LOW (ref 32.0–48.0)

## 2018-01-23 LAB — BLOOD GAS, VENOUS
Acid-base deficit: 5.4 mmol/L — ABNORMAL HIGH (ref 0.0–2.0)
Bicarbonate: 19.1 mmol/L — ABNORMAL LOW (ref 20.0–28.0)
O2 Saturation: 65.1 %
PATIENT TEMPERATURE: 37
PH VEN: 7.37 (ref 7.250–7.430)
pCO2, Ven: 33 mmHg — ABNORMAL LOW (ref 44.0–60.0)
pO2, Ven: 35 mmHg (ref 32.0–45.0)

## 2018-01-23 LAB — ACETAMINOPHEN LEVEL: Acetaminophen (Tylenol), Serum: <10 ug/mL — ABNORMAL LOW (ref 10–30)

## 2018-01-23 LAB — TROPONIN I: Troponin I: <0.03 ng/mL (ref <0.03)

## 2018-01-23 LAB — ETHANOL

## 2018-01-23 LAB — SALICYLATE LEVEL

## 2018-01-23 MED ORDER — LORAZEPAM 2 MG/ML IJ SOLN
2.0000 mg | Freq: Once | INTRAMUSCULAR | Status: AC
  Administered 2018-01-23: 2 mg via INTRAVENOUS

## 2018-01-23 MED ORDER — SUCCINYLCHOLINE CHLORIDE 20 MG/ML IJ SOLN: 100.0000 mg | Freq: Once | INTRAMUSCULAR | Status: DC

## 2018-01-23 MED ORDER — ETOMIDATE 2 MG/ML IV SOLN
20.0000 mg | Freq: Once | INTRAVENOUS | Status: AC
  Administered 2018-01-23: 20 mg via INTRAVENOUS

## 2018-01-23 MED ORDER — CHLORHEXIDINE GLUCONATE 0.12% ORAL RINSE (MEDLINE KIT)
15.0000 mL | Freq: Two times a day (BID) | OROMUCOSAL | Status: DC
  Administered 2018-01-24 – 2018-01-25 (×3): 15 mL via OROMUCOSAL

## 2018-01-23 MED ORDER — FLUMAZENIL 0.5 MG/5ML IV SOLN
0.2000 mg | Freq: Once | INTRAVENOUS | Status: AC
  Administered 2018-01-23: 0.2 mg via INTRAVENOUS
  Filled 2018-01-23: qty 5

## 2018-01-23 MED ORDER — FENTANYL CITRATE (PF) 100 MCG/2ML IJ SOLN: 50.0000 ug | INTRAMUSCULAR | Status: DC | PRN

## 2018-01-23 MED ORDER — PROPOFOL 1000 MG/100ML IV EMUL
0.0000 ug/kg/min | INTRAVENOUS | Status: DC
  Administered 2018-01-24: 10 ug/kg/min via INTRAVENOUS
  Filled 2018-01-23: qty 100

## 2018-01-23 MED ORDER — HYDRALAZINE HCL 20 MG/ML IJ SOLN: 5.0000 mg | INTRAMUSCULAR | Status: DC | PRN

## 2018-01-23 MED ORDER — SODIUM BICARBONATE 8.4 % IV SOLN
50.0000 meq | Freq: Once | INTRAVENOUS | Status: AC
Start: 2018-01-23 — End: 2018-01-23
  Administered 2018-01-23: 50 meq via INTRAVENOUS
  Filled 2018-01-23: qty 50

## 2018-01-23 MED ORDER — ENOXAPARIN SODIUM 30 MG/0.3ML ~~LOC~~ SOLN
30.0000 mg | SUBCUTANEOUS | Status: DC
  Administered 2018-01-24: 30 mg via SUBCUTANEOUS
  Filled 2018-01-23: qty 0.3

## 2018-01-23 MED ORDER — LORAZEPAM 2 MG/ML IJ SOLN
INTRAMUSCULAR | Status: AC
  Filled 2018-01-23: qty 1

## 2018-01-23 MED ORDER — PROPOFOL 1000 MG/100ML IV EMUL
INTRAVENOUS | Status: AC
  Administered 2018-01-23: 5 ug/kg/min via INTRAVENOUS
  Filled 2018-01-23: qty 100

## 2018-01-23 MED ORDER — PROPOFOL 1000 MG/100ML IV EMUL
5.0000 ug/kg/min | Freq: Once | INTRAVENOUS | Status: AC
  Administered 2018-01-23: 5 ug/kg/min via INTRAVENOUS

## 2018-01-23 MED ORDER — ETOMIDATE 2 MG/ML IV SOLN: 20.0000 mg | Freq: Once | INTRAVENOUS | Status: DC

## 2018-01-23 MED ORDER — LORAZEPAM 2 MG/ML IJ SOLN
2.0000 mg | INTRAMUSCULAR | Status: DC | PRN
  Administered 2018-01-24: 2 mg via INTRAVENOUS
  Filled 2018-01-23: qty 1

## 2018-01-23 MED ORDER — ORAL CARE MOUTH RINSE
15.0000 mL | OROMUCOSAL | Status: DC
  Administered 2018-01-24 – 2018-01-25 (×12): 15 mL via OROMUCOSAL

## 2018-01-23 MED ORDER — NALOXONE HCL 2 MG/2ML IJ SOSY
1.0000 mg | PREFILLED_SYRINGE | Freq: Once | INTRAMUSCULAR | Status: AC
  Administered 2018-01-23: 1 mg via INTRAVENOUS
  Filled 2018-01-23: qty 2

## 2018-01-23 MED ORDER — SODIUM BICARBONATE 8.4 % IV SOLN
INTRAVENOUS | Status: DC
  Administered 2018-01-23 – 2018-01-24 (×2): via INTRAVENOUS
  Filled 2018-01-23 (×8): qty 150

## 2018-01-23 MED ORDER — SODIUM CHLORIDE 0.9 % IV SOLN
Freq: Once | INTRAVENOUS | Status: AC
Start: 2018-01-23 — End: 2018-01-24
  Administered 2018-01-23: 19:00:00 via INTRAVENOUS

## 2018-01-23 MED ORDER — SUCCINYLCHOLINE CHLORIDE 20 MG/ML IJ SOLN
100.0000 mg | Freq: Once | INTRAMUSCULAR | Status: AC
  Administered 2018-01-23: 100 mg via INTRAVENOUS

## 2018-01-23 MED ORDER — ACETAMINOPHEN 650 MG RE SUPP: 650.0000 mg | Freq: Four times a day (QID) | RECTAL | Status: DC | PRN

## 2018-01-23 MED ORDER — SODIUM BICARBONATE 8.4 % IV SOLN
INTRAVENOUS | Status: DC
  Filled 2018-01-23 (×3): qty 150

## 2018-01-23 MED ORDER — ACETAMINOPHEN 325 MG PO TABS: 650.0000 mg | ORAL_TABLET | Freq: Four times a day (QID) | ORAL | Status: DC | PRN

## 2018-01-23 NOTE — Progress Notes (Signed)
Transported pt to ICU on vent without incident. Pt remains on vent and tol well at this time. Report given to ICU RT. 

## 2018-01-23 NOTE — ED Provider Notes (Addendum)
[facility] Emergency Department Provider Note       Time seen: ----------------------------------------- 6:38 PM on 01/23/2018 -----------------------------------------  Level V caveat: History/ROS limited by altered mental status I have reviewed the triage vital signs and the nursing notes.  HISTORY   Chief Complaint No chief complaint on file.    HPI Melanie Reid is a 52 y.o. female with a history of diverticulitis and kidney stones who presents to the ED for altered mental status.  EMS was called by the boyfriend who states she is been asleep since 230 this morning.  They drank some alcohol last night but denied any illicit drug use.  She presents with bottles of Zanaflex, Lexapro and amitriptyline which are empty.  No further information is available.  Past Medical History:  Diagnosis Date  . Diverticulitis   . Kidney stones     Patient Active Problem List   Diagnosis Date Noted  . Kidney stones     Past Surgical History:  Procedure Laterality Date  . KNEE ARTHROSCOPY    . TUBAL LIGATION      Allergies Patient has no known allergies.  Social History Social History   Tobacco Use  . Smoking status: Never Smoker  Substance Use Topics  . Alcohol use: Yes  . Drug use: Not on file   Review of Systems Unknown, patient is largely unresponsive  All systems negative/normal/unremarkable except as stated in the HPI  ____________________________________________   PHYSICAL EXAM:  VITAL SIGNS: ED Triage Vitals  Enc Vitals Group     BP      Pulse      Resp      Temp      Temp src      SpO2      Weight      Height      Head Circumference      Peak Flow      Pain Score      Pain Loc      Pain Edu?      Excl. in GC?    Constitutional: Lethargic but responds to repeated verbal or painful stimuli, localizes to pain Eyes: Conjunctivae are normal. ENT   Head: Normocephalic and atraumatic.   Nose: No  congestion/rhinnorhea.   Mouth/Throat: Mucous membranes are dry   Neck: No stridor. Cardiovascular: Normal rate, regular rhythm. No murmurs, rubs, or gallops. Respiratory: Normal respiratory effort without tachypnea nor retractions. Breath sounds are clear and equal bilaterally.  Gastrointestinal: Normal bowel sounds Musculoskeletal: Nontender with normal range of motion in extremities. No lower extremity tenderness nor edema. Neurologic: GCS is 10, E2, V3, M5, patient is very lethargic at this time Skin:  Skin is warm, dry and intact. No rash noted. ____________________________________________  EKG: Interpreted by me.  Sinus rhythm the rate of 63 bpm, QRS width is 115, normal PR interval, normal QT, normal axis  ____________________________________________  ED COURSE:  As part of my medical decision making, I reviewed the following data within the electronic MEDICAL RECORD NUMBER History obtained from family if available, nursing notes, old chart and ekg, as well as notes from prior ED visits. Patient presented for altered mental status, we will assess with labs and imaging as indicated at this time.  Patient will also receive Narcan and flumazenil   Procedure Name: Intubation Date/Time: 01/23/2018 8:24 PM Performed by: Emily FilbertWilliams, Amyia Lodwick E, MD Pre-anesthesia Checklist: Patient identified, Emergency Drugs available, Suction available and Patient being monitored Preoxygenation: Pre-oxygenation with 100% oxygen Induction Type: IV  induction and Rapid sequence Ventilation: Mask ventilation without difficulty Laryngoscope Size: Glidescope and 4 Tube size: 7.5 mm Number of attempts: 1 Placement Confirmation: ETT inserted through vocal cords under direct vision,  CO2 detector and Breath sounds checked- equal and bilateral Dental Injury: Teeth and Oropharynx as per pre-operative assessment  Difficulty Due To: Difficulty was unanticipated      ____________________________________________    LABS (pertinent positives/negatives)  Labs Reviewed  CBC WITH DIFFERENTIAL/PLATELET - Abnormal; Notable for the following components:      Result Value   RBC 3.54 (*)    Hemoglobin 11.9 (*)    HCT 34.0 (*)    Lymphs Abs 0.6 (*)    All other components within normal limits  COMPREHENSIVE METABOLIC PANEL - Abnormal; Notable for the following components:   CO2 21 (*)    Glucose, Bld 132 (*)    BUN 51 (*)    Creatinine, Ser 2.27 (*)    Calcium 8.8 (*)    AST 139 (*)    ALT 80 (*)    Total Bilirubin 1.5 (*)    GFR calc non Af Amer 24 (*)    GFR calc Af Amer 28 (*)    Anion gap 16 (*)    All other components within normal limits  URINALYSIS, COMPLETE (UACMP) WITH MICROSCOPIC - Abnormal; Notable for the following components:   Color, Urine YELLOW (*)    APPearance HAZY (*)    Hgb urine dipstick LARGE (*)    Ketones, ur 20 (*)    Protein, ur 30 (*)    Bacteria, UA RARE (*)    All other components within normal limits  BLOOD GAS, VENOUS - Abnormal; Notable for the following components:   pCO2, Ven 33 (*)    Bicarbonate 19.1 (*)    Acid-base deficit 5.4 (*)    All other components within normal limits  URINE DRUG SCREEN, QUALITATIVE ([facility] ONLY) - Abnormal; Notable for the following components:   Tricyclic, Ur Screen POSITIVE (*)    Cocaine Metabolite,Ur [place] POSITIVE (*)    Benzodiazepine, Ur Scrn TEST NOT PERFORMED, REAGENT NOT AVAILABLE (*)    All other components within normal limits  ACETAMINOPHEN LEVEL - Abnormal; Notable for the following components:   Acetaminophen (Tylenol), Serum <10 (*)    All other components within normal limits  TROPONIN I  ETHANOL  SALICYLATE LEVEL  CBG MONITORING, ED   CRITICAL CARE Performed by: Ulice Dash   Total critical care time: 30 minutes  Critical care time was exclusive of separately billable procedures and treating other patients.  Critical care was necessary to treat or prevent imminent or life-threatening  deterioration.  Critical care was time spent personally by me on the following activities: development of treatment plan with patient and/or surrogate as well as nursing, discussions with consultants, evaluation of patient's response to treatment, examination of patient, obtaining history from patient or surrogate, ordering and performing treatments and interventions, ordering and review of laboratory studies, ordering and review of radiographic studies, pulse oximetry and re-evaluation of patient's condition.  RADIOLOGY Images were viewed by me  CT head, chest x-ray IMPRESSION: 1. No CT evidence for acute intracranial abnormality. 2. Mild atrophy and small vessel ischemic changes of the white matter IMPRESSION: No active disease.  Minimal left base atelectasis ____________________________________________  DIFFERENTIAL DIAGNOSIS   Overdose, medication side effect, CVA, occult infection, assault  FINAL ASSESSMENT AND PLAN  Altered mental status, acute renal failure, likely TCA overdose, cocaine use   Plan:  The patient had presented for altered mental status. Patient's labs did indicate acute renal failure and slightly elevated anion gap with a mild respiratory acidosis. Patient's imaging not reveal any acute process.  She has received IV fluids, initially we tried Narcan and flumazenil without any improvement.  This is likely a TCA overdose.  We have started D5W with 3 A of bicarb.  She is intermittently responsive but her mental status continued to deteriorate.  We decided to intubate her as dictated above.  She is currently rate not requiring any sedation.   Ulice Dash, MD   Note: This note was generated in part or whole with voice recognition software. Voice recognition is usually quite accurate but there are transcription errors that can and very often do occur. I apologize for any typographical errors that were not detected and corrected.     Emily Filbert,  MD 01/23/18 Gweneth Fritter    Emily Filbert, MD 01/23/18 2025

## 2018-01-23 NOTE — ED Notes (Signed)
Pt to CT

## 2018-01-23 NOTE — Consult Note (Signed)
CHIEF COMPLAINT:   Chief Complaint  Patient presents with  . unresponsive    Subjective  52 y.o. female with a known history of diverticulitis and kidney stones presenting to the ED with altered mental status. Unable to obtain history from patient due to patient being intubated and altered. Per report, EMS was called by her boyfriend due to not waking up. Her and her boyfriend had been drinking last night. They went to sleep around 0230 last night and patient never woke up. EMS found empty bottles of Amitriptyline, Zanaflex, and Lexapro at her house. Patient was given Narcan and Flumazenil in the ED, with no improvement in mental status. She was intubated in the ED.  +cocaine on Tox screen +ARF creat 2.2 Placed on biCARB infusion for TCAD OD Started on propofol   ROS unobtainable due to resp failure  Objective   Examination:  PHYSICAL EXAMINATION:  GENERAL:critically ill appearing, +resp distress HEAD: Normocephalic, atraumatic.  EYES: Pupils equal, round, reactive to light.  No scleral icterus.  MOUTH: Moist mucosal membrane. NECK: Supple. No thyromegaly. No nodules. No JVD.  PULMONARY: +rhonchi, +wheezing CARDIOVASCULAR: S1 and S2. Regular rate and rhythm. No murmurs, rubs, or gallops.  GASTROINTESTINAL: Soft, nontender, -distended. No masses. Positive bowel sounds. No hepatosplenomegaly.  MUSCULOSKELETAL: No swelling, clubbing, or edema.  NEUROLOGIC: obtunded SKIN:intact,warm,dry    VITALS:  height is '5\' 4"'  (1.626 m) and weight is 165 lb (74.8 kg). Her blood pressure is 139/66 and her pulse is 64. Her respiration is 21 (abnormal) and oxygen saturation is 100%.   I personally reviewed Labs under Results section.  Radiology Reports Dg Chest 1 View  Result Date: 01/23/2018 CLINICAL DATA:  Altered mental status EXAM: CHEST  1 VIEW COMPARISON:  None. FINDINGS: Minimal atelectasis at the left base. Heart size within normal limits allowing for rotation. No pneumothorax.  IMPRESSION: No active disease.  Minimal left base atelectasis Electronically Signed   By: Donavan Foil M.D.   On: 01/23/2018 19:54   Ct Head Wo Contrast  Result Date: 01/23/2018 CLINICAL DATA:  Altered mental status EXAM: CT HEAD WITHOUT CONTRAST TECHNIQUE: Contiguous axial images were obtained from the base of the skull through the vertex without intravenous contrast. COMPARISON:  None. FINDINGS: Brain: No acute territorial infarction, hemorrhage or intracranial mass. Scattered hypodensity in the white matter likely small vessel disease. Mild atrophy. Nonenlarged ventricles Vascular: No hyperdense vessels.  No unexpected calcification Skull: Normal. Negative for fracture or focal lesion. Sinuses/Orbits: No acute finding. Other: None. IMPRESSION: 1. No CT evidence for acute intracranial abnormality. 2. Mild atrophy and small vessel ischemic changes of the white matter Electronically Signed   By: Donavan Foil M.D.   On: 01/23/2018 19:01   Dg Chest Portable 1 View  Result Date: 01/23/2018 CLINICAL DATA:  Intubation EXAM: PORTABLE CHEST 1 VIEW COMPARISON:  01/23/2018 FINDINGS: Interval intubation, tip of the endotracheal tube is about 4.1 cm superior to the carina. Right lung is clear. Mild cardiomegaly. Streaky atelectasis at the left base. No pneumothorax. IMPRESSION: 1. Endotracheal tube tip about 4.1 cm superior to carina 2. Borderline cardiomegaly. Streaky atelectasis at the left lung base. Electronically Signed   By: Donavan Foil M.D.   On: 01/23/2018 21:02   I have Independently reviewed images of  CXR  8.1.19 Interpretation: ?atalectasis LLL Need to advance ETT 2 cm     Assessment/Plan:  52 yo female with acute and severe resp failure from acute encephalopathy from acute Drug abuse(Cocaine and TCAD's) inability to protect  airway complicated by acute renal failure  Severe Hypoxic and Hypercapnic Respiratory Failure -continue Full MV support -continue Bronchodilator Therapy -Wean Fio2 and  PEEP as tolerated -will perform SAT/SBTTwhen respiratory parameters are met   NEUROLOGY-encephalopathy - intubated and sedated - minimal sedation to achieve a RASS goal: -1 -assess neuro status  Renal Failure-most likely due to ATN -follow chem 7 -follow UO -continue Foley Catheter-assess need    Critical Care Time devoted to patient care services described in this note is 43 minutes.   Overall, patient is critically ill, prognosis is guarded.  Patient with Multiorgan failure and at high risk for cardiac arrest and death.    Corrin Parker, M.D.  [facility]  Medical Director [place] [facility]

## 2018-01-23 NOTE — H&P (Signed)
[employer] [facility] [place] at [facility]   PATIENT NAME: Melanie Reid    MR#:  [id number]  DATE OF BIRTH:  1966-04-15  DATE OF ADMISSION:  01/23/2018  PRIMARY CARE PHYSICIAN: Armando Gang, FNP   REQUESTING/REFERRING PHYSICIAN: Daryel November, MD  CHIEF COMPLAINT:   Chief Complaint  Patient presents with  . unresponsive    HISTORY OF PRESENT ILLNESS:  Melanie Reid  is a 52 y.o. female with a known history of diverticulitis and kidney stones presenting to the ED with altered mental status. Unable to obtain history from patient due to patient being intubated and altered. Per report, EMS was called by her boyfriend due to not waking up. Her and her boyfriend had been drinking last night. They went to sleep around 0230 last night and patient never woke up. EMS found empty bottles of Amitriptyline, Zanaflex, and Lexapro at her house. Patient was given Narcan and Flumazenil in the ED, with no improvement in mental status. She was intubated in the ED.  PAST MEDICAL HISTORY:   Past Medical History:  Diagnosis Date  . Diverticulitis   . Kidney stones     PAST SURGICAL HISTORY:   Past Surgical History:  Procedure Laterality Date  . KNEE ARTHROSCOPY    . TUBAL LIGATION      SOCIAL HISTORY:   Social History   Tobacco Use  . Smoking status: Never Smoker  Substance Use Topics  . Alcohol use: Yes    FAMILY HISTORY:  No family history on file.  DRUG ALLERGIES:  No Known Allergies  REVIEW OF SYSTEMS:   ROS  Unable to obtain due to patient being altered and intubated.  MEDICATIONS AT HOME:   Prior to Admission medications   Medication Sig Start Date End Date Taking? Authorizing Provider  buPROPion (WELLBUTRIN SR) 150 MG 12 hr tablet Take 150 mg by mouth daily.    [provider]  escitalopram (LEXAPRO) 10 MG tablet Take 10 mg by mouth daily.    [provider]  HYDROcodone-acetaminophen (NORCO/VICODIN) 5-325 MG per tablet Take  1-2 tablets by mouth every 6 (six) hours as needed for pain. Patient not taking: Reported on 01/23/2018 04/20/13   Piepenbrink, Victorino Dike, PA-C  losartan-hydrochlorothiazide (HYZAAR) 100-12.5 MG per tablet Take 1 tablet by mouth daily.    [provider]  ondansetron (ZOFRAN ODT) 4 MG disintegrating tablet Take 1 tablet (4 mg total) by mouth every 8 (eight) hours as needed for nausea. Patient not taking: Reported on 01/23/2018 04/20/13   Piepenbrink, Victorino Dike, PA-C      VITAL SIGNS:  Blood pressure (!) 153/86, pulse 61, resp. rate 20, height 5\' 4"  (1.626 m), weight 74.8 kg (165 lb), SpO2 100 %.  PHYSICAL EXAMINATION:  Physical Exam  GENERAL:  52 y.o.-year-old patient lying in the bed, unresponsive, intubated EYES: Pupils equal, round, reactive to light and accommodation. No scleral icterus. Extraocular muscles intact.  HEENT: Head atraumatic, normocephalic. ET tube in place. NECK:  Supple, no jugular venous distention. No thyroid enlargement, no tenderness.  LUNGS: Normal breath sounds bilaterally, no wheezing, rales,rhonchi or crepitation. No use of accessory muscles of respiration.  CARDIOVASCULAR: S1, S2 normal. No murmurs, rubs, or gallops.  ABDOMEN: Soft, nontender, nondistended. Bowel sounds present. No organomegaly or mass.  EXTREMITIES: No pedal edema, cyanosis, or clubbing.  NEUROLOGIC: Does not respond to voice. PSYCHIATRIC: The patient is alert and oriented x 3.  SKIN: No obvious rash, lesion, or ulcer. Few scattered bruises present.  LABORATORY PANEL:  CBC Recent Labs  Lab 01/23/18 1845  WBC 7.1  HGB 11.9*  HCT 34.0*  PLT 178   ------------------------------------------------------------------------------------------------------------------  Chemistries  Recent Labs  Lab 01/23/18 1845  NA 137  K 4.3  CL 100  CO2 21*  GLUCOSE 132*  BUN 51*  CREATININE 2.27*  CALCIUM 8.8*  AST 139*  ALT 80*  ALKPHOS 83  BILITOT 1.5*    ------------------------------------------------------------------------------------------------------------------  Cardiac Enzymes Recent Labs  Lab 01/23/18 1845  TROPONINI <0.03   ------------------------------------------------------------------------------------------------------------------  RADIOLOGY:  Dg Chest 1 View  Result Date: 01/23/2018 CLINICAL DATA:  Altered mental status EXAM: CHEST  1 VIEW COMPARISON:  None. FINDINGS: Minimal atelectasis at the left base. Heart size within normal limits allowing for rotation. No pneumothorax. IMPRESSION: No active disease.  Minimal left base atelectasis Electronically Signed   By: Jasmine PangKim  Fujinaga M.D.   On: 01/23/2018 19:54   Ct Head Wo Contrast  Result Date: 01/23/2018 CLINICAL DATA:  Altered mental status EXAM: CT HEAD WITHOUT CONTRAST TECHNIQUE: Contiguous axial images were obtained from the base of the skull through the vertex without intravenous contrast. COMPARISON:  None. FINDINGS: Brain: No acute territorial infarction, hemorrhage or intracranial mass. Scattered hypodensity in the white matter likely small vessel disease. Mild atrophy. Nonenlarged ventricles Vascular: No hyperdense vessels.  No unexpected calcification Skull: Normal. Negative for fracture or focal lesion. Sinuses/Orbits: No acute finding. Other: None. IMPRESSION: 1. No CT evidence for acute intracranial abnormality. 2. Mild atrophy and small vessel ischemic changes of the white matter Electronically Signed   By: Jasmine PangKim  Fujinaga M.D.   On: 01/23/2018 19:01   Dg Chest Portable 1 View  Result Date: 01/23/2018 CLINICAL DATA:  Intubation EXAM: PORTABLE CHEST 1 VIEW COMPARISON:  01/23/2018 FINDINGS: Interval intubation, tip of the endotracheal tube is about 4.1 cm superior to the carina. Right lung is clear. Mild cardiomegaly. Streaky atelectasis at the left base. No pneumothorax. IMPRESSION: 1. Endotracheal tube tip about 4.1 cm superior to carina 2. Borderline cardiomegaly.  Streaky atelectasis at the left lung base. Electronically Signed   By: Jasmine PangKim  Fujinaga M.D.   On: 01/23/2018 21:02    IMPRESSION AND PLAN:   Altered mental status/mild respiratory acidosis- possibly due to TCA overdose, as an empty bottle of Amitriptyline was found at her home. UDS also positive for cocaine. CT head was negative. - Vent management per CCM - Continue sodium bicarb - Neuro checks - Cardiac monitoring  Acute renal failure- likely due to dehydration. Cr 2.27 in the ED (normal at baseline). S/p 1L NS bolus. - Continue sodium bicarb  - Holding home Losartan-HCTZ - Avoid nephrotoxic agents - Recheck bmp in the morning  Hypertension- BPs mildly elevated - Holding home Losartan-HCTZ - Hydralazine prn  Anxiety - Holding home Wellbutrin and Lexapro while intubated  Chronic back pain - Holding Amitriptyline and Norco  All the records are reviewed and case discussed with ED provider. Management plans discussed with the patient, family and they are in agreement.  CODE STATUS: FULL  TOTAL TIME TAKING CARE OF THIS PATIENT: 35 minutes.    Jinny BlossomKaty D Mayo M.D on 01/23/2018 at 9:40 PM  Between 7am to 6pm - Pager - [phone]  After 6pm go to [email or web address] - Social research officer, governmentpassword [email or web address]  [facility] [place] [facility]  Office  [phone]  CC: Primary care physician; Armando GangLindley, Cheryl P, FNP   Note: This dictation was prepared with Dragon dictation along with smaller phrase technology. Any transcriptional errors that result from this process are unintentional.

## 2018-01-23 NOTE — ED Notes (Signed)
Pt continues to be unresponsive after medication given.

## 2018-01-24 ENCOUNTER — Inpatient Hospital Stay: Payer: Medicaid Other

## 2018-01-24 DIAGNOSIS — R410 Disorientation, unspecified: Secondary | ICD-10-CM

## 2018-01-24 DIAGNOSIS — F1410 Cocaine abuse, uncomplicated: Secondary | ICD-10-CM

## 2018-01-24 DIAGNOSIS — Z5181 Encounter for therapeutic drug level monitoring: Secondary | ICD-10-CM

## 2018-01-24 LAB — BASIC METABOLIC PANEL
ANION GAP: 10 (ref 5–15)
Anion gap: 8 (ref 5–15)
BUN: 48 mg/dL — AB (ref 6–20)
BUN: 43 mg/dL — AB (ref 6–20)
CALCIUM: 7.6 mg/dL — AB (ref 8.9–10.3)
CALCIUM: 7.1 mg/dL — AB (ref 8.9–10.3)
CHLORIDE: 99 mmol/L (ref 98–111)
CO2: 28 mmol/L (ref 22–32)
CO2: 31 mmol/L (ref 22–32)
CREATININE: 1.92 mg/dL — AB (ref 0.44–1.00)
CREATININE: 1.65 mg/dL — AB (ref 0.44–1.00)
Chloride: 100 mmol/L (ref 98–111)
GFR calc Af Amer: 34 mL/min — ABNORMAL LOW (ref >60)
GFR calc Af Amer: 41 mL/min — ABNORMAL LOW (ref >60)
GFR calc non Af Amer: 35 mL/min — ABNORMAL LOW (ref >60)
GFR, EST NON AFRICAN AMERICAN: 29 mL/min — AB (ref >60)
GLUCOSE: 153 mg/dL — AB (ref 70–99)
Glucose, Bld: 161 mg/dL — ABNORMAL HIGH (ref 70–99)
Potassium: 3.5 mmol/L (ref 3.5–5.1)
Potassium: 3 mmol/L — ABNORMAL LOW (ref 3.5–5.1)
SODIUM: 138 mmol/L (ref 135–145)
Sodium: 138 mmol/L (ref 135–145)

## 2018-01-24 LAB — GLUCOSE, CAPILLARY
GLUCOSE-CAPILLARY: 221 mg/dL — AB (ref 70–99)
GLUCOSE-CAPILLARY: 129 mg/dL — AB (ref 70–99)
GLUCOSE-CAPILLARY: 105 mg/dL — AB (ref 70–99)
Glucose-Capillary: 147 mg/dL — ABNORMAL HIGH (ref 70–99)

## 2018-01-24 LAB — LACTIC ACID, PLASMA: LACTIC ACID, VENOUS: 1.3 mmol/L (ref 0.5–1.9)

## 2018-01-24 LAB — CBC
HCT: 32.3 % — ABNORMAL LOW (ref 35.0–47.0)
HEMOGLOBIN: 11.2 g/dL — AB (ref 12.0–16.0)
MCH: 33 pg (ref 26.0–34.0)
MCHC: 34.7 g/dL (ref 32.0–36.0)
MCV: 95 fL (ref 80.0–100.0)
Platelets: 136 10*3/uL — ABNORMAL LOW (ref 150–440)
RBC: 3.4 MIL/uL — ABNORMAL LOW (ref 3.80–5.20)
RDW: 14.2 % (ref 11.5–14.5)
WBC: 5.6 10*3/uL (ref 3.6–11.0)

## 2018-01-24 LAB — TRIGLYCERIDES: Triglycerides: 168 mg/dL — ABNORMAL HIGH (ref <150)

## 2018-01-24 LAB — MRSA PCR SCREENING: MRSA BY PCR: NEGATIVE

## 2018-01-24 LAB — MAGNESIUM
MAGNESIUM: 2.1 mg/dL (ref 1.7–2.4)
Magnesium: 2.1 mg/dL (ref 1.7–2.4)

## 2018-01-24 LAB — PHOSPHORUS: Phosphorus: 4.6 mg/dL (ref 2.5–4.6)

## 2018-01-24 LAB — CK
Total CK: 9133 U/L — ABNORMAL HIGH (ref 38–234)
Total CK: 4603 U/L — ABNORMAL HIGH (ref 38–234)

## 2018-01-24 MED ORDER — FAMOTIDINE IN NACL 20-0.9 MG/50ML-% IV SOLN
20.0000 mg | Freq: Every day | INTRAVENOUS | Status: DC
  Administered 2018-01-24 – 2018-01-25 (×2): 20 mg via INTRAVENOUS
  Filled 2018-01-24 (×2): qty 50

## 2018-01-24 MED ORDER — POTASSIUM CHLORIDE 20 MEQ/15ML (10%) PO SOLN
20.0000 meq | ORAL | Status: AC
  Administered 2018-01-24: 20 meq
  Filled 2018-01-24 (×3): qty 15

## 2018-01-24 MED ORDER — SODIUM CHLORIDE 0.9 % IV SOLN
1.0000 g | Freq: Once | INTRAVENOUS | Status: AC
  Administered 2018-01-24: 1 g via INTRAVENOUS
  Filled 2018-01-24: qty 10

## 2018-01-24 MED ORDER — SODIUM CHLORIDE 0.9 % IV SOLN: INTRAVENOUS | Status: AC

## 2018-01-24 MED ORDER — SODIUM CHLORIDE 0.9 % IV SOLN
INTRAVENOUS | Status: DC
  Administered 2018-01-24: 05:00:00 via INTRAVENOUS

## 2018-01-24 MED ORDER — DEXTROSE-NACL 5-0.45 % IV SOLN
INTRAVENOUS | Status: DC
  Administered 2018-01-24 – 2018-01-25 (×2): via INTRAVENOUS

## 2018-01-24 MED ORDER — POTASSIUM CHLORIDE 10 MEQ/100ML IV SOLN
10.0000 meq | INTRAVENOUS | Status: AC
  Administered 2018-01-24 (×5): 10 meq via INTRAVENOUS
  Filled 2018-01-24 (×6): qty 100

## 2018-01-24 MED ORDER — ENOXAPARIN SODIUM 40 MG/0.4ML ~~LOC~~ SOLN
40.0000 mg | SUBCUTANEOUS | Status: DC
  Administered 2018-01-24 – 2018-01-25 (×2): 40 mg via SUBCUTANEOUS
  Filled 2018-01-24 (×2): qty 0.4

## 2018-01-24 NOTE — Progress Notes (Signed)
Spoke with Verlon AuLeslie, [facility] to alert them to pt.'s possible overdose. Discussed lab results and empty medication bottles that boyfriend brought to the hospital. Discussed VS, recent labs and 12 lead results.  Poison control recommendations: -Trend Mag and Phos with chemistry - get lactic baseline and total CPK -keep K+ and Mg on higher side of normal - if pt. Seizes give benzos, if that doesn't work give phenobarbitol - trend QRS & Qtc, if QRS > 140, give boluses of bicarb.  Called E-link spoke with RN, instructed to write note with recommendations for MD to read. Will continue to monitor pt. Closely.

## 2018-01-24 NOTE — Progress Notes (Signed)
eLink Physician-Brief Progress Note Patient Name: Melanie BiddingGinger B Dedman DOB: 03/24/1966 MRN: [id number]   Date of Service  01/24/2018  HPI/Events of Note  Pt admitted with altered mental status secondary to TCA and cocaine OD. Intubated for airway protection and now on the ventilator.  eICU Interventions  Bicarb infusion at 100 ml/hr, check CPK, Mg, phosphorus, lactate, repeat EKG, monitor closely for clinical seizures        Melanie Reid 01/24/2018, 12:42 AM

## 2018-01-24 NOTE — Progress Notes (Signed)
Pt. Extubated per MD order. No complications. Pt currently on room air SAT 96%. Will continue to monitor.

## 2018-01-24 NOTE — Progress Notes (Signed)
[employer] - [place] at [facility]   PATIENT NAME: Melanie Reid    MR#:  [id number]  DATE OF BIRTH:  [birth year]  SUBJECTIVE:  CHIEF COMPLAINT:   Chief Complaint  Patient presents with  . unresponsive   Admitted for unresponsiveness after drug overdose.  On full ventilatory support today. Off sedation and slowly waking up.  Not following commands.  Drowsy.  REVIEW OF SYSTEMS:    Review of Systems  Unable to perform ROS: Intubated    DRUG ALLERGIES:  No Known Allergies  VITALS:  Blood pressure (!) 98/56, pulse 77, temperature 99 F (37.2 C), resp. rate 17, height 5\' 4"  (1.626 m), weight 71.1 kg (156 lb 12 oz), SpO2 95 %.  PHYSICAL EXAMINATION:   Physical Exam  GENERAL:  52 y.o.-year-old patient lying in the bed.  ET tube in place EYES: Pupils equal, round, reactive to light and accommodation.  HEENT: Head atraumatic, normocephalic. Oropharynx and nasopharynx clear.  NECK:  Supple, no jugular venous distention. No thyroid enlargement, no tenderness.  LUNGS: Normal breath sounds bilaterally, no wheezing, rales, rhonchi. No use of accessory muscles of respiration.  CARDIOVASCULAR: S1, S2 normal. No murmurs, rubs, or gallops.  ABDOMEN: Soft, nontender, nondistended. Bowel sounds present.  EXTREMITIES: No cyanosis, clubbing or edema b/l.    PSYCHIATRIC: The patient is drowsy moving her arms but not following commands SKIN: No obvious rash, lesion, or ulcer.   LABORATORY PANEL:   CBC Recent Labs  Lab 01/24/18 0104  WBC 5.6  HGB 11.2*  HCT 32.3*  PLT 136*   ------------------------------------------------------------------------------------------------------------------ Chemistries  Recent Labs  Lab 01/23/18 1845 01/24/18 0103 01/24/18 0104  NA 137  --  138  K 4.3  --  3.5  CL 100  --  100  CO2 21*  --  28  GLUCOSE 132*  --  153*  BUN 51*  --  48*  CREATININE 2.27*  --  1.92*  CALCIUM 8.8*  --  7.6*  MG  --  2.1  --   AST 139*  --   --    ALT 80*  --   --   ALKPHOS 83  --   --   BILITOT 1.5*  --   --    ------------------------------------------------------------------------------------------------------------------  Cardiac Enzymes Recent Labs  Lab 01/23/18 1845  TROPONINI <0.03   ------------------------------------------------------------------------------------------------------------------  RADIOLOGY:  Dg Chest 1 View  Result Date: 01/23/2018 CLINICAL DATA:  Altered mental status EXAM: CHEST  1 VIEW COMPARISON:  None. FINDINGS: Minimal atelectasis at the left base. Heart size within normal limits allowing for rotation. No pneumothorax. IMPRESSION: No active disease.  Minimal left base atelectasis Electronically Signed   By: Jasmine Pang M.D.   On: 01/23/2018 19:54   Dg Abd 1 View  Result Date: 01/24/2018 CLINICAL DATA:  Orogastric tube placement. EXAM: ABDOMEN - 1 VIEW COMPARISON:  Chest radiograph January 23, 2018 FINDINGS: Nasogastric tube tip projects in gastric antrum. LEFT lung base consolidation and small pleural effusion. Included bowel gas pattern is nondilated and nonobstructive. Soft tissue planes and included osseous structures are non suspicious. IMPRESSION: Nasogastric tube tip projects in gastric antrum. LEFT lung base consolidation and small pleural effusion. Electronically Signed   By: Awilda Metro M.D.   On: 01/24/2018 05:27   Ct Head Wo Contrast  Result Date: 01/23/2018 CLINICAL DATA:  Altered mental status EXAM: CT HEAD WITHOUT CONTRAST TECHNIQUE: Contiguous axial images were obtained from the base of the skull through the vertex without intravenous contrast. COMPARISON:  None. FINDINGS: Brain: No acute territorial infarction, hemorrhage or intracranial mass. Scattered hypodensity in the white matter likely small vessel disease. Mild atrophy. Nonenlarged ventricles Vascular: No hyperdense vessels.  No unexpected calcification Skull: Normal. Negative for fracture or focal lesion. Sinuses/Orbits: No  acute finding. Other: None. IMPRESSION: 1. No CT evidence for acute intracranial abnormality. 2. Mild atrophy and small vessel ischemic changes of the white matter Electronically Signed   By: Jasmine PangKim  Fujinaga M.D.   On: 01/23/2018 19:01   Dg Chest Portable 1 View  Result Date: 01/23/2018 CLINICAL DATA:  Intubation EXAM: PORTABLE CHEST 1 VIEW COMPARISON:  01/23/2018 FINDINGS: Interval intubation, tip of the endotracheal tube is about 4.1 cm superior to the carina. Right lung is clear. Mild cardiomegaly. Streaky atelectasis at the left base. No pneumothorax. IMPRESSION: 1. Endotracheal tube tip about 4.1 cm superior to carina 2. Borderline cardiomegaly. Streaky atelectasis at the left lung base. Electronically Signed   By: Jasmine PangKim  Fujinaga M.D.   On: 01/23/2018 21:02     ASSESSMENT AND PLAN:   *Acute toxic encephalopathy due to drug overdose Intubated to protect airway.  Vent management per pulmonary critical care medicine. Poison control contacted  *Acute kidney injury secondary to dehydration/rhabdomyolysis.  Improving.  Follow CK levels.  Monitor input and output.  Hold losartan hydrochlorothiazide from home.  Repeat labs in the morning  *Hypertension.  IV medications as needed  *DVT prophylaxis with heparin  All the records are reviewed and case discussed with Care Management/Social Workerr. Management plans discussed with the patient, family and they are in agreement.  CODE STATUS: FULL CODE  TOTAL TIME TAKING CARE OF THIS PATIENT: 35 minutes.   Molinda BailiffSrikar R Kaytlin Burklow M.D on 01/24/2018 at 12:39 PM  Between 7am to 6pm - Pager - [phone]  After 6pm go to [email or web address] - password [email or web address] [facility]  [facility]  Office  [phone]  CC: Primary care physician; Armando GangLindley, Cheryl P, FNP  Note: This dictation was prepared with Dragon dictation along with smaller phrase technology. Any transcriptional errors that result from this process are unintentional.

## 2018-01-24 NOTE — Progress Notes (Signed)
eLink Physician-Brief Progress Note Patient Name: Melanie Reid DOB: 03-14-1966 MRN: [id number]   Date of Service  01/24/2018  HPI/Events of Note  Rhabdomyolysis, hypothermia, low normal serum K+  eICU Interventions  1000 ml 0.9 % saline fluid bolus over 4 hours, Bare Hugger for hypothermia, KCL 20 meq via NG tube Q 4 hrs x 2 doses then recheck K+        Okoronkwo U Ogan 01/24/2018, 5:02 AM

## 2018-01-24 NOTE — Progress Notes (Signed)
Follow up - Critical Care Medicine Note  Patient Details:    Melanie Reid is an 52 y.o.femalewith a known history of diverticulitis and kidney stones presenting to the ED with altered mental status. Unable to obtain history from patient due to patient being intubated and altered. Per report, EMS was called by her boyfriend due to not waking up. Her and her boyfriend had been drinking last night. They went to sleep around 0230 last night and patient never woke up. EMS found empty bottles of Amitriptyline, Zanaflex, and Lexapro at her house. Patient was given Narcan and Flumazenil in the ED, with no improvement in mental status. She was intubated in the ED.   Lines, Airways, Drains: Airway 7.5 mm (Active)  Secured at (cm) 23 cm 01/24/2018  7:00 AM  Measured From Lips 01/24/2018  7:00 AM  Secured Location Right 01/24/2018  7:00 AM  Secured By [employer] 01/24/2018  7:00 AM  Tube Holder Repositioned Yes 01/24/2018  7:00 AM  Cuff Pressure (cm H2O) 28 cm H2O 01/24/2018  7:00 AM  Site Condition Dry 01/24/2018  7:00 AM     NG/OG Tube Orogastric Center mouth Xray Measured external length of tube 49 cm (Active)  External Length of Tube (cm) - (if applicable) 49 cm 01/24/1571 12:00 AM  Site Assessment Clean;Dry;Intact 01/24/2018  4:00 AM  Ongoing Placement Verification No change in cm markings or external length of tube from initial placement;No change in respiratory status;No acute changes, not attributed to clinical condition;Other (Comment) 01/24/2018  4:00 AM  Status Suction-low intermittent 01/24/2018  4:00 AM  Amount of suction 80 mmHg 01/24/2018  4:00 AM  Drainage Appearance Green;Brown 01/24/2018  4:00 AM  Intake (mL) 45 mL 01/24/2018  5:20 AM  Output (mL) 100 mL 01/24/2018  6:00 AM     Urethral Catheter Mayra, EDT Double-lumen 14 Fr. (Active)  Indication for Insertion or Continuance of Catheter Unstable critical patients (first 24-48 hours) 01/24/2018  4:00 AM  Site Assessment Clean;Intact;Dry 01/24/2018   4:00 AM  Catheter Maintenance Bag below level of bladder;Catheter secured;Drainage bag/tubing not touching floor;Insertion date on drainage bag;No dependent loops;Seal intact;Bag emptied prior to transport 01/24/2018  4:00 AM  Collection Container Standard drainage bag 01/24/2018  4:00 AM  Securement Method Securing device (Describe) 01/24/2018  4:00 AM  Output (mL) 45 mL 01/24/2018  6:00 AM    Anti-infectives:  Anti-infectives (From admission, onward)   None      Microbiology: Results for orders placed or performed during the hospital encounter of 01/23/18  MRSA PCR Screening     Status: None   Collection Time: 01/24/18 12:06 AM  Result Value Ref Range Status   MRSA by PCR NEGATIVE NEGATIVE Final    Comment:        The GeneXpert MRSA Assay (FDA approved for NASAL specimens only), is one component of a comprehensive MRSA colonization surveillance program. It is not intended to diagnose MRSA infection nor to guide or monitor treatment for MRSA infections. Performed at [facility], [address] [place], [place] [address]    Studies: Dg Chest 1 View  Result Date: 01/23/2018 CLINICAL DATA:  Altered mental status EXAM: CHEST  1 VIEW COMPARISON:  None. FINDINGS: Minimal atelectasis at the left base. Heart size within normal limits allowing for rotation. No pneumothorax. IMPRESSION: No active disease.  Minimal left base atelectasis Electronically Signed   By: Donavan Foil M.D.   On: 01/23/2018 19:54   Dg Abd 1 View  Result Date:  01/24/2018 CLINICAL DATA:  Orogastric tube placement. EXAM: ABDOMEN - 1 VIEW COMPARISON:  Chest radiograph January 23, 2018 FINDINGS: Nasogastric tube tip projects in gastric antrum. LEFT lung base consolidation and small pleural effusion. Included bowel gas pattern is nondilated and nonobstructive. Soft tissue planes and included osseous structures are non suspicious. IMPRESSION: Nasogastric tube tip projects in gastric antrum. LEFT lung base consolidation  and small pleural effusion. Electronically Signed   By: Elon Alas M.D.   On: 01/24/2018 05:27   Ct Head Wo Contrast  Result Date: 01/23/2018 CLINICAL DATA:  Altered mental status EXAM: CT HEAD WITHOUT CONTRAST TECHNIQUE: Contiguous axial images were obtained from the base of the skull through the vertex without intravenous contrast. COMPARISON:  None. FINDINGS: Brain: No acute territorial infarction, hemorrhage or intracranial mass. Scattered hypodensity in the white matter likely small vessel disease. Mild atrophy. Nonenlarged ventricles Vascular: No hyperdense vessels.  No unexpected calcification Skull: Normal. Negative for fracture or focal lesion. Sinuses/Orbits: No acute finding. Other: None. IMPRESSION: 1. No CT evidence for acute intracranial abnormality. 2. Mild atrophy and small vessel ischemic changes of the white matter Electronically Signed   By: Donavan Foil M.D.   On: 01/23/2018 19:01   Dg Chest Portable 1 View  Result Date: 01/23/2018 CLINICAL DATA:  Intubation EXAM: PORTABLE CHEST 1 VIEW COMPARISON:  01/23/2018 FINDINGS: Interval intubation, tip of the endotracheal tube is about 4.1 cm superior to the carina. Right lung is clear. Mild cardiomegaly. Streaky atelectasis at the left base. No pneumothorax. IMPRESSION: 1. Endotracheal tube tip about 4.1 cm superior to carina 2. Borderline cardiomegaly. Streaky atelectasis at the left lung base. Electronically Signed   By: Donavan Foil M.D.   On: 01/23/2018 21:02    Consults:    Subjective:    Overnight Issues: patient has remained sedated overnight. Presently on propofol and sodium bicarbonate infusion. Being followed up by poison control with close monitoring of QTC  Objective:  Vital signs for last 24 hours: Temp:  [94.5 F (34.7 C)-97.5 F (36.4 C)] 96.8 F (36 C) (08/02 0700) Pulse Rate:  [56-65] 60 (08/02 0700) Resp:  [12-21] 18 (08/02 0700) BP: (81-156)/(42-94) 91/51 (08/02 0700) SpO2:  [90 %-100 %] 100 % (08/02  0700) FiO2 (%):  [24 %-40 %] 24 % (08/02 0700) Weight:  [156 lb 12 oz (71.1 kg)-165 lb (74.8 kg)] 156 lb 12 oz (71.1 kg) (08/01 2300)  Hemodynamic parameters for last 24 hours:    Intake/Output from previous day: 08/01 0701 - 08/02 0700 In: 1413.9 [I.V.:1368.9; NG/GT:45] Out: 755 [Urine:355; Emesis/NG output:400]  Intake/Output this shift: No intake/output data recorded.  Vent settings for last 24 hours: Vent Mode: PRVC FiO2 (%):  [24 %-40 %] 24 % Set Rate:  [12 bmp-18 bmp] 12 bmp Vt Set:  [430 mL] 430 mL PEEP:  [5 cmH20] 5 cmH20 Plateau Pressure:  [11 cmH20-14 cmH20] 11 cmH20  Physical Exam:  GENERAL:critically ill appearing, +resp distress HEAD: Normocephalic, atraumatic.  EYES: Pupils equal, round, reactive to light.  No scleral icterus.  MOUTH: Moist mucosal membrane. NECK: Supple. No thyromegaly. No nodules. No JVD.  PULMONARY: +rhonchi, +wheezing CARDIOVASCULAR: S1 and S2. Regular rate and rhythm. No murmurs, rubs, or gallops.  GASTROINTESTINAL: Soft, nontender, -distended. No masses. Positive bowel sounds. No hepatosplenomegaly.  MUSCULOSKELETAL: No swelling, clubbing, or edema.  NEUROLOGIC: obtunded SKIN:intact,warm,dry    Assessment/Plan:   52 yo female with acute and severe resp failure from acute encephalopathy from acute Drug abuse(Cocaine and TCAD's) inability to protect airway  complicated by acute renal failure  Severe Hypoxic and Hypercapnic Respiratory Failure -continue Full MV support -continue Bronchodilator Therapy -Wean Fio2 and PEEP as tolerated -will perform SAT/SBTTwhen respiratory parameters are met   NEUROLOGY-encephalopathy - intubated and sedated - minimal sedation to achieve a RASS goal: -1 -assess neuro status  Renal Failure-most likely due to ATN, improving on this morning's lab Patient is also noted to have rhabdomyolysis with a CK of 9133, presently being hydrated and on a bicarbonate infusion  -follow chem 7 -follow  UO -continue Foley Catheter-assess need   Critical Care Time devoted to patient care services described in this note is 35 minutes.    Tahliyah Anagnos 01/24/2018  *Care during the described time interval was provided by me and/or other providers on the critical care team.  I have reviewed this patient's available data, including medical history, events of note, physical examination and test results as part of my evaluation.

## 2018-01-24 NOTE — Progress Notes (Signed)
Pt. Temp 34.7 on esophageal probe, confirmed with axillary temp of 93.8.  [facility]. Pt. K+ 3.5, recommendations from poison control was to keep K+ on "high side of normal", MD ordered potassium replacement. Will continue to monitor pt. Closely.

## 2018-01-24 NOTE — Progress Notes (Signed)
Spoke with [employer], requested the measurement of Qtc and Qt as current monitoring on bedside screen shows a big jump from 12 lead in ED. Bedside: Qtc-524 12 lead: Qtc-472  [employer] measured Qtc and reported 490 Requested Q 4 measurement to receive accurate information

## 2018-01-24 NOTE — Progress Notes (Signed)
Pharmacy Electrolyte Monitoring Consult:  Pharmacy consulted to assist in monitoring and replacing electrolytes in this 52 y.o. female admitted on 01/23/2018 with unresponsive  Patient is now extubated. Patient received potassium 40mEq VT x 1 and sodium bicarb infusion at 11350mL/hr.     Labs:  Sodium (mmol/L)  Date Value  01/24/2018 138   Potassium (mmol/L)  Date Value  01/24/2018 3.0 (L)   Magnesium (mg/dL)  Date Value  69/62/952808/07/2017 2.1   Phosphorus (mg/dL)  Date Value  41/32/440108/07/2017 4.6   Calcium (mg/dL)  Date Value  02/72/536608/07/2017 7.1 (L)   Albumin (g/dL)  Date Value  44/03/474208/06/2017 4.3    Assessment/Plan:  Patient with no oral access. Will order potassium 10mEq IV Q1hr x 6 hours. Will order calcium gluconate 1g IV x 1.   Goal potassium ~4, goal magnesium ~2, goal phos > 2.5, goal calcium ~ 8.8.   Will recheck electrolytes with am labs.   Pharmacy will continue to monitor and adjust per consult.   Simpson,Michael L 01/24/2018 4:03 PM

## 2018-01-24 NOTE — Progress Notes (Signed)
Alerted Dr. Warrick Parisiangan to pt.'s SBP maintaining in the 80's, with MAP's around 65 since starting IVF bolus @ 250 mL/h. Discussed options, given verbal order to increase rate of NS bolus to 999 mL/h. Will continue to monitor pt. Closely.

## 2018-01-24 NOTE — Plan of Care (Signed)
Pt. Alternating btwn RASS: +2 and -5, no eye opening & unable to follow commands, reflexes intact. Poison Control notified of possible OD, positive for tricyclics. Pt. On bear hugger for temp of 34.7, slowly trending up, NSR to SB, BP in 80's after 0500, 1 L NS bolus given- effective. UOP adequate btwn 30-50 mL/h  Ventilator settings remain the same, no care concerns at this time. Report given Nehemiah SettleBrooke

## 2018-01-24 NOTE — Consult Note (Signed)
[facility] Consult   Reason for Consult: Consult for this 52 year old woman brought in today after being found unresponsive this morning Referring Physician: Tressia Miners Patient Identification: Melanie Reid MRN:  [id number] Principal Diagnosis: Acute delirium Diagnosis:   Patient Active Problem List   Diagnosis Date Noted  . Acute delirium [R41.0] 01/24/2018  . Cocaine abuse ([place]) [F14.10] 01/24/2018  . Altered mental status [R41.82] 01/23/2018  . Kidney stones [N20.0]     Total Time spent with patient: 30 minutes  Subjective:   Melanie Reid is a 52 y.o. female patient admitted with patient not able to give information.  HPI: Chart reviewed.  Came by to see patient but she is not able to participate in an interview.  Spoke with nursing.  52 year old woman brought to the hospital after family found her unresponsive this morning.  Some speculation about the possibility of overdose.  Patient was reportedly given Narcan without dramatic improvement.  Now in the ICU.  Not on a ventilator but responsive only with incoherent vocalizations and responsive to pain.  Chart indicates that the history is that the patient had been drinking the night previously.  Unclear how much.  Drug screen positive for cocaine.  Nursing tells me secondhand that her understanding from the family is that no one was aware of any recent suicidal ideation.  Past Psychiatric History: No history of psychiatric treatment in the hospital no previous psychiatric notes identified.  Risk to Self:   Risk to Others:   Prior Inpatient Therapy:   Prior Outpatient Therapy:    Past Medical History:  Past Medical History:  Diagnosis Date  . Diverticulitis   . Kidney stones     Past Surgical History:  Procedure Laterality Date  . KNEE ARTHROSCOPY    . TUBAL LIGATION     Family History: No family history on file. Family Psychiatric  History: Unknown Social History:  Social History   Substance and  Sexual Activity  Alcohol Use Yes     Social History   Substance and Sexual Activity  Drug Use Not on file    Social History   Socioeconomic History  . Marital status: Single    Spouse name: Not on file  . Number of children: Not on file  . Years of education: Not on file  . Highest education level: Not on file  Occupational History  . Not on file  Social Needs  . Financial resource strain: Not on file  . Food insecurity:    Worry: Not on file    Inability: Not on file  . Transportation needs:    Medical: Not on file    Non-medical: Not on file  Tobacco Use  . Smoking status: Never Smoker  Substance and Sexual Activity  . Alcohol use: Yes  . Drug use: Not on file  . Sexual activity: Not on file  Lifestyle  . Physical activity:    Days per week: Not on file    Minutes per session: Not on file  . Stress: Not on file  Relationships  . Social connections:    Talks on phone: Not on file    Gets together: Not on file    Attends religious service: Not on file    Active member of club or organization: Not on file    Attends meetings of clubs or organizations: Not on file    Relationship status: Not on file  Other Topics Concern  . Not on file  Social History Narrative  .  Not on file   Additional Social History:    Allergies:  No Known Allergies  Labs:  Results for orders placed or performed during the hospital encounter of 01/23/18 (from the past 48 hour(s))  Urinalysis, Complete w Microscopic     Status: Abnormal   Collection Time: 01/23/18  6:38 PM  Result Value Ref Range   Color, Urine YELLOW (A) YELLOW   APPearance HAZY (A) CLEAR   Specific Gravity, Urine 1.016 1.005 - 1.030   pH 5.0 5.0 - 8.0   Glucose, UA NEGATIVE NEGATIVE mg/dL   Hgb urine dipstick LARGE (A) NEGATIVE   Bilirubin Urine NEGATIVE NEGATIVE   Ketones, ur 20 (A) NEGATIVE mg/dL   Protein, ur 30 (A) NEGATIVE mg/dL   Nitrite NEGATIVE NEGATIVE   Leukocytes, UA NEGATIVE NEGATIVE   RBC / HPF  0-5 0 - 5 RBC/hpf   WBC, UA 0-5 0 - 5 WBC/hpf   Bacteria, UA RARE (A) NONE SEEN   Squamous Epithelial / LPF 0-5 0 - 5   Mucus PRESENT    Hyaline Casts, UA PRESENT     Comment: Performed at [facility], [address] [place], [place] [address]  CBC with Differential     Status: Abnormal   Collection Time: 01/23/18  6:45 PM  Result Value Ref Range   WBC 7.1 3.6 - 11.0 K/uL   RBC 3.54 (L) 3.80 - 5.20 MIL/uL   Hemoglobin 11.9 (L) 12.0 - 16.0 g/dL   HCT 34.0 (L) 35.0 - 47.0 %   MCV 96.1 80.0 - 100.0 fL   MCH 33.7 26.0 - 34.0 pg   MCHC 35.1 32.0 - 36.0 g/dL   RDW 14.1 11.5 - 14.5 %   Platelets 178 150 - 440 K/uL   Neutrophils Relative % 85 %   Neutro Abs 6.1 1.4 - 6.5 K/uL   Lymphocytes Relative 9 %   Lymphs Abs 0.6 (L) 1.0 - 3.6 K/uL   Monocytes Relative 6 %   Monocytes Absolute 0.5 0.2 - 0.9 K/uL   Eosinophils Relative 0 %   Eosinophils Absolute 0.0 0 - 0.7 K/uL   Basophils Relative 0 %   Basophils Absolute 0.0 0 - 0.1 K/uL    Comment: Performed at [facility], [address] [place], [place] [address]  Comprehensive metabolic panel     Status: Abnormal   Collection Time: 01/23/18  6:45 PM  Result Value Ref Range   Sodium 137 135 - 145 mmol/L   Potassium 4.3 3.5 - 5.1 mmol/L   Chloride 100 98 - 111 mmol/L   CO2 21 (L) 22 - 32 mmol/L   Glucose, Bld 132 (H) 70 - 99 mg/dL   BUN 51 (H) 6 - 20 mg/dL   Creatinine, Ser 2.27 (H) 0.44 - 1.00 mg/dL   Calcium 8.8 (L) 8.9 - 10.3 mg/dL   Total Protein 7.5 6.5 - 8.1 g/dL   Albumin 4.3 3.5 - 5.0 g/dL   AST 139 (H) 15 - 41 U/L   ALT 80 (H) 0 - 44 U/L   Alkaline Phosphatase 83 38 - 126 U/L   Total Bilirubin 1.5 (H) 0.3 - 1.2 mg/dL   GFR calc non Af Amer 24 (L) >60 mL/min   GFR calc Af Amer 28 (L) >60 mL/min    Comment: (NOTE) The eGFR has been calculated using the CKD EPI equation. This calculation has not been validated in all clinical situations. eGFR's persistently <60 mL/min signify possible Chronic  Kidney Disease.  Anion gap 16 (H) 5 - 15    Comment: Performed at [facility], [address] [place], [place] [address]  Troponin I     Status: None   Collection Time: 01/23/18  6:45 PM  Result Value Ref Range   Troponin I <0.03 <0.03 ng/mL    Comment: Performed at [facility], [address] [place], [place] [address]  Blood gas, venous     Status: Abnormal   Collection Time: 01/23/18  6:45 PM  Result Value Ref Range   pH, Ven 7.37 7.250 - 7.430   pCO2, Ven 33 (L) 44.0 - 60.0 mmHg   pO2, Ven 35.0 32.0 - 45.0 mmHg   Bicarbonate 19.1 (L) 20.0 - 28.0 mmol/L   Acid-base deficit 5.4 (H) 0.0 - 2.0 mmol/L   O2 Saturation 65.1 %   Patient temperature 37.0    Collection site VENOUS    Sample type VENOUS     Comment: Performed at [facility], [address] [place], [place] [address]  Urine Drug Screen, Qualitative ([facility] only)     Status: Abnormal   Collection Time: 01/23/18  6:45 PM  Result Value Ref Range   Tricyclic, Ur Screen POSITIVE (A) NONE DETECTED   Amphetamines, Ur Screen NONE DETECTED NONE DETECTED   MDMA (Ecstasy)Ur Screen NONE DETECTED NONE DETECTED   Cocaine Metabolite,Ur [place] POSITIVE (A) NONE DETECTED   Opiate, Ur Screen NONE DETECTED NONE DETECTED   Phencyclidine (PCP) Ur S NONE DETECTED NONE DETECTED   Cannabinoid 50 Ng, Ur [place] NONE DETECTED NONE DETECTED   Barbiturates, Ur Screen NONE DETECTED NONE DETECTED   Benzodiazepine, Ur Scrn TEST NOT PERFORMED, REAGENT NOT AVAILABLE (A) NONE DETECTED   Methadone Scn, Ur NONE DETECTED NONE DETECTED    Comment: (NOTE) Tricyclics + metabolites, urine    Cutoff 1000 ng/mL Amphetamines + metabolites, urine  Cutoff 1000 ng/mL MDMA (Ecstasy), urine              Cutoff 500 ng/mL Cocaine Metabolite, urine          Cutoff 300 ng/mL Opiate + metabolites, urine        Cutoff 300 ng/mL Phencyclidine (PCP), urine         Cutoff 25 ng/mL Cannabinoid, urine                 Cutoff 50 ng/mL Barbiturates  + metabolites, urine  Cutoff 200 ng/mL Benzodiazepine, urine              Cutoff 200 ng/mL Methadone, urine                   Cutoff 300 ng/mL The urine drug screen provides only a preliminary, unconfirmed analytical test result and should not be used for non-medical purposes. Clinical consideration and professional judgment should be applied to any positive drug screen result due to possible interfering substances. A more specific alternate chemical method must be used in order to obtain a confirmed analytical result. Gas chromatography / mass spectrometry (GC/MS) is the preferred confirmat ory method. Performed at [facility], [address] [place], [place] [address]   Ethanol     Status: None   Collection Time: 01/23/18  6:45 PM  Result Value Ref Range   Alcohol, Ethyl (B) <10 <10 mg/dL    Comment: (NOTE) Lowest detectable limit for serum alcohol is 10 mg/dL. For medical purposes only. Performed at [facility], [address] [place], [place] [address]   Acetaminophen  level     Status: Abnormal   Collection Time: 01/23/18  6:45 PM  Result Value Ref Range   Acetaminophen (Tylenol), Serum <10 (L) 10 - 30 ug/mL    Comment: (NOTE) Therapeutic concentrations vary significantly. A range of 10-30 ug/mL  may be an effective concentration for many patients. However, some  are best treated at concentrations outside of this range. Acetaminophen concentrations >150 ug/mL at 4 hours after ingestion  and >50 ug/mL at 12 hours after ingestion are often associated with  toxic reactions. Performed at [facility], [address] [place], [place] [address]   Salicylate level     Status: None   Collection Time: 01/23/18  6:45 PM  Result Value Ref Range   Salicylate Lvl <5.6 2.8 - 30.0 mg/dL    Comment: Performed at [facility], [address] [place], [place] [address]  Blood gas, arterial     Status: Abnormal   Collection Time: 01/23/18  10:16 PM  Result Value Ref Range   FIO2 0.40    Delivery systems VENTILATOR    Mode ASSIST CONTROL    VT 430 mL   Peep/cpap 5.0 cm H20   pH, Arterial 7.45 7.350 - 7.450   pCO2 arterial 28 (L) 32.0 - 48.0 mmHg   pO2, Arterial 153 (H) 83.0 - 108.0 mmHg   Bicarbonate 19.5 (L) 20.0 - 28.0 mmol/L   Acid-base deficit 2.7 (H) 0.0 - 2.0 mmol/L   O2 Saturation 99.4 %   Patient temperature 37.0    Collection site RIGHT RADIAL    Sample type ARTERIAL DRAW    Allens test (pass/fail) PASS PASS   Mechanical Rate 18     Comment: Performed at [facility], [address] [place], [place] [address]  Glucose, capillary     Status: Abnormal   Collection Time: 01/23/18 11:06 PM  Result Value Ref Range   Glucose-Capillary 221 (H) 70 - 99 mg/dL  Triglycerides     Status: Abnormal   Collection Time: 01/23/18 11:32 PM  Result Value Ref Range   Triglycerides 168 (H) <150 mg/dL    Comment: Performed at [facility], [address], [place], [place] [address]  MRSA PCR Screening     Status: None   Collection Time: 01/24/18 12:06 AM  Result Value Ref Range   MRSA by PCR NEGATIVE NEGATIVE    Comment:        The GeneXpert MRSA Assay (FDA approved for NASAL specimens only), is one component of a comprehensive MRSA colonization surveillance program. It is not intended to diagnose MRSA infection nor to guide or monitor treatment for MRSA infections. Performed at [facility], [address] [place], [place] [address]   Phosphorus     Status: None   Collection Time: 01/24/18  1:03 AM  Result Value Ref Range   Phosphorus 4.6 2.5 - 4.6 mg/dL    Comment: Performed at [facility], [address] [place], [place] [address]  Magnesium     Status: None   Collection Time: 01/24/18  1:03 AM  Result Value Ref Range   Magnesium 2.1 1.7 - 2.4 mg/dL    Comment: Performed at [facility], [address] [place], [place] [address]  Lactic acid, plasma      Status: None   Collection Time: 01/24/18  1:03 AM  Result Value Ref Range   Lactic Acid, Venous 1.3 0.5 - 1.9 mmol/L    Comment: Performed at [facility],  [place], [place] [address]  CK     Status: Abnormal   Collection Time: 01/24/18  1:03 AM  Result Value Ref Range   Total CK 9,133 (H) 38 - 234 U/L    Comment: RESULT CONFIRMED BY MANUAL DILUTION / [facility]LC Performed at [facility] [address] [place], [place] [address]   Basic metabolic panel     Status: Abnormal   Collection Time: 01/24/18  1:04 AM  Result Value Ref Range   Sodium 138 135 - 145 mmol/L   Potassium 3.5 3.5 - 5.1 mmol/L   Chloride 100 98 - 111 mmol/L   CO2 28 22 - 32 mmol/L   Glucose, Bld 153 (H) 70 - 99 mg/dL   BUN 48 (H) 6 - 20 mg/dL   Creatinine, Ser 1.92 (H) 0.44 - 1.00 mg/dL   Calcium 7.6 (L) 8.9 - 10.3 mg/dL   GFR calc non Af Amer 29 (L) >60 mL/min   GFR calc Af Amer 34 (L) >60 mL/min    Comment: (NOTE) The eGFR has been calculated using the CKD EPI equation. This calculation has not been validated in all clinical situations. eGFR's persistently <60 mL/min signify possible Chronic Kidney Disease.    Anion gap 10 5 - 15    Comment: Performed at [facility], [address] [place], [place] [address]  CBC     Status: Abnormal   Collection Time: 01/24/18  1:04 AM  Result Value Ref Range   WBC 5.6 3.6 - 11.0 K/uL   RBC 3.40 (L) 3.80 - 5.20 MIL/uL   Hemoglobin 11.2 (L) 12.0 - 16.0 g/dL   HCT 32.3 (L) 35.0 - 47.0 %   MCV 95.0 80.0 - 100.0 fL   MCH 33.0 26.0 - 34.0 pg   MCHC 34.7 32.0 - 36.0 g/dL   RDW 14.2 11.5 - 14.5 %   Platelets 136 (L) 150 - 440 K/uL    Comment: Performed at [facility], [address] [place], [place] [address]  Glucose, capillary     Status: Abnormal   Collection Time: 01/24/18  3:48 AM  Result Value Ref Range   Glucose-Capillary 147 (H) 70 - 99 mg/dL  Basic metabolic panel     Status: Abnormal   Collection Time:  01/24/18 12:52 PM  Result Value Ref Range   Sodium 138 135 - 145 mmol/L   Potassium 3.0 (L) 3.5 - 5.1 mmol/L   Chloride 99 98 - 111 mmol/L   CO2 31 22 - 32 mmol/L   Glucose, Bld 161 (H) 70 - 99 mg/dL   BUN 43 (H) 6 - 20 mg/dL   Creatinine, Ser 1.65 (H) 0.44 - 1.00 mg/dL   Calcium 7.1 (L) 8.9 - 10.3 mg/dL   GFR calc non Af Amer 35 (L) >60 mL/min   GFR calc Af Amer 41 (L) >60 mL/min    Comment: (NOTE) The eGFR has been calculated using the CKD EPI equation. This calculation has not been validated in all clinical situations. eGFR's persistently <60 mL/min signify possible Chronic Kidney Disease.    Anion gap 8 5 - 15    Comment: Performed at [facility], [address] [place], [place] [address]  CK     Status: Abnormal   Collection Time: 01/24/18 12:52 PM  Result Value Ref Range   Total CK 4,603 (H) 38 - 234 U/L    Comment: RESULT CONFIRMED BY MANUAL DILUTION.MSS Performed at [facility], [address] [place], [place] [address]   Magnesium     Status: None   Collection Time: 01/24/18 12:52 PM  Result Value Ref Range   Magnesium 2.1 1.7 - 2.4 mg/dL    Comment: Performed at [facility], [address] [place], [place] [address]    Current Facility-Administered Medications  Medication Dose Route Frequency Provider Last Rate Last Dose  . acetaminophen (TYLENOL) tablet 650 mg  650 mg Oral Q6H PRN Mayo, Pete Pelt, MD       Or  . acetaminophen (TYLENOL) suppository 650 mg  650 mg Rectal Q6H PRN Mayo, Pete Pelt, MD      . calcium gluconate 1 g in sodium chloride 0.9 % 100 mL IVPB  1 g Intravenous Once Sudini, Srikar, MD      . chlorhexidine gluconate (MEDLINE KIT) (PERIDEX) 0.12 % solution 15 mL  15 mL Mouth Rinse BID Flora Lipps, MD   15 mL at 01/24/18 0722  . enoxaparin (LOVENOX) injection 40 mg  40 mg Subcutaneous Q24H Sudini, Srikar, MD      . famotidine (PEPCID) IVPB 20 mg premix  20 mg Intravenous Daily Flora Lipps, MD   Stopped at  01/24/18 1152  . hydrALAZINE (APRESOLINE) injection 5 mg  5 mg Intravenous Q4H PRN Mayo, Pete Pelt, MD      . LORazepam (ATIVAN) injection 2-4 mg  2-4 mg Intravenous Q1H PRN Flora Lipps, MD   2 mg at 01/24/18 0211  . MEDLINE mouth rinse  15 mL Mouth Rinse 10 times per day Flora Lipps, MD   15 mL at 01/24/18 1025  . potassium chloride 10 mEq in 100 mL IVPB  10 mEq Intravenous Q1 Hr x 6 Sudini, Srikar, MD      . sodium bicarbonate 150 mEq in dextrose 5 % 1,000 mL infusion   Intravenous Continuous Frederik Pear, MD 100 mL/hr at 01/24/18 0700      Musculoskeletal: Strength & Muscle Tone: decreased Gait & Station: unable to stand Patient leans: N/A  Psychiatric Specialty Exam: Physical Exam  Nursing note and vitals reviewed. Constitutional: She appears well-developed and well-nourished.  HENT:  Head: Normocephalic and atraumatic.  Eyes: Pupils are equal, round, and reactive to light. Conjunctivae are normal.  Neck: Normal range of motion.  Cardiovascular: Regular rhythm and normal heart sounds.  Respiratory: Effort normal. No respiratory distress.  GI: Soft.  Musculoskeletal: Normal range of motion.  Neurological: She is alert.  Skin: Skin is warm and dry.  Psychiatric: Her affect is blunt.    Review of Systems  Unable to perform ROS: Patient unresponsive    Blood pressure (!) 98/56, pulse 77, temperature 99 F (37.2 C), resp. rate 17, height _0  (1.626 m), weight 71.1 kg (156 lb 12 oz), SpO2 94 %.Body mass index is 26.91 kg/m.  General Appearance: Disheveled  Eye Contact:  None  Speech:  Garbled  Volume:  Decreased  Mood:  Negative  Affect:  Constricted  Thought Process:  Disorganized  Orientation:  Negative  Thought Content:  Negative  Suicidal Thoughts:  No  Homicidal Thoughts:  No  Memory:  Immediate;   Poor Recent;   Poor Remote;   Poor  Judgement:  Negative  Insight:  Negative  Psychomotor Activity:  Negative  Concentration:  Concentration: Negative   Recall:  Negative  Fund of Knowledge:  Negative  Language:  Negative  Akathisia:  Negative  Handed:  Right  AIMS (if indicated):     Assets:  Housing  ADL's:  Impaired  Cognition:  Impaired,  Severe  Sleep:        Treatment Plan Summary: Plan Patient currently is unresponsive except to painful stimuli at which she will have incoherent vocalizations.  Not able to participate in interview.  Clinical presentation could be consistent with overdose unclear if there was any intentionality to it or what might of been taken.  I will follow-up and attempt to see the patient again tomorrow.  Disposition: Patient does not meet criteria for psychiatric inpatient admission.  Alethia Berthold, MD 01/24/2018 3:55 PM

## 2018-01-24 NOTE — Progress Notes (Signed)
[facility]'s [facility]. MD stated he will place orders for 12 lead, labs and to alert MD if there are any signs of seizure activity. Will continue to monitor pt. Closely.

## 2018-01-25 ENCOUNTER — Inpatient Hospital Stay: Payer: Medicaid Other

## 2018-01-25 ENCOUNTER — Other Ambulatory Visit: Payer: Self-pay

## 2018-01-25 DIAGNOSIS — Z5181 Encounter for therapeutic drug level monitoring: Secondary | ICD-10-CM

## 2018-01-25 LAB — ALBUMIN: Albumin: 3.2 g/dL — ABNORMAL LOW (ref 3.5–5.0)

## 2018-01-25 LAB — BASIC METABOLIC PANEL
ANION GAP: 7 (ref 5–15)
Anion gap: 10 (ref 5–15)
BUN: 27 mg/dL — AB (ref 6–20)
BUN: 20 mg/dL (ref 6–20)
CALCIUM: 7.8 mg/dL — AB (ref 8.9–10.3)
CHLORIDE: 101 mmol/L (ref 98–111)
CHLORIDE: 100 mmol/L (ref 98–111)
CO2: 27 mmol/L (ref 22–32)
CO2: 25 mmol/L (ref 22–32)
Calcium: 8.3 mg/dL — ABNORMAL LOW (ref 8.9–10.3)
Creatinine, Ser: 1.57 mg/dL — ABNORMAL HIGH (ref 0.44–1.00)
Creatinine, Ser: 1.42 mg/dL — ABNORMAL HIGH (ref 0.44–1.00)
GFR calc non Af Amer: 37 mL/min — ABNORMAL LOW (ref >60)
GFR calc non Af Amer: 42 mL/min — ABNORMAL LOW (ref >60)
GFR, EST AFRICAN AMERICAN: 43 mL/min — AB (ref >60)
GFR, EST AFRICAN AMERICAN: 49 mL/min — AB (ref >60)
GLUCOSE: 108 mg/dL — AB (ref 70–99)
Glucose, Bld: 109 mg/dL — ABNORMAL HIGH (ref 70–99)
POTASSIUM: 3.2 mmol/L — AB (ref 3.5–5.1)
POTASSIUM: 3.7 mmol/L (ref 3.5–5.1)
SODIUM: 135 mmol/L (ref 135–145)
Sodium: 135 mmol/L (ref 135–145)

## 2018-01-25 LAB — PHOSPHORUS: Phosphorus: 1.7 mg/dL — ABNORMAL LOW (ref 2.5–4.6)

## 2018-01-25 LAB — MAGNESIUM: Magnesium: 2 mg/dL (ref 1.7–2.4)

## 2018-01-25 LAB — GLUCOSE, CAPILLARY: Glucose-Capillary: 111 mg/dL — ABNORMAL HIGH (ref 70–99)

## 2018-01-25 LAB — HIV ANTIBODY (ROUTINE TESTING W REFLEX): HIV SCREEN 4TH GENERATION: NONREACTIVE

## 2018-01-25 LAB — CK: Total CK: 2821 U/L — ABNORMAL HIGH (ref 38–234)

## 2018-01-25 MED ORDER — PIPERACILLIN-TAZOBACTAM 3.375 G IVPB
3.3750 g | Freq: Three times a day (TID) | INTRAVENOUS | Status: DC
  Administered 2018-01-25 – 2018-01-26 (×2): 3.375 g via INTRAVENOUS
  Filled 2018-01-25 (×2): qty 50

## 2018-01-25 MED ORDER — POTASSIUM PHOSPHATES 15 MMOLE/5ML IV SOLN
20.0000 mmol | Freq: Once | INTRAVENOUS | Status: AC
  Administered 2018-01-25: 20 mmol via INTRAVENOUS
  Filled 2018-01-25: qty 6.67

## 2018-01-25 MED ORDER — POTASSIUM CHLORIDE 10 MEQ/100ML IV SOLN
10.0000 meq | INTRAVENOUS | Status: AC
  Administered 2018-01-25 (×3): 10 meq via INTRAVENOUS
  Filled 2018-01-25 (×3): qty 100

## 2018-01-25 NOTE — Progress Notes (Signed)
Gave report to EatonAmanda, Charity fundraiserN. PT Transferred.

## 2018-01-25 NOTE — Progress Notes (Signed)
PT Cancellation Note  Patient Details Name: Melanie Reid MRN: [id number] DOB: 10/29/1965   Cancelled Treatment:    Reason Eval/Treat Not Completed: Patient's level of consciousness Pt transferred from CCU to floor, spoke with nursing who reports that pt is still too lethargic and unable to open eyes to be able to participate with PT today. Will try back when pt is more appropriate and able to participate.  Malachi ProGalen R Kristofor Michalowski, DPT 01/25/2018, 3:34 PM

## 2018-01-25 NOTE — Consult Note (Signed)
[facility] Consult   Reason for Consult: Consult for this patient who presented to the emergency room with altered mental status of unclear etiology.  Concern about overdose. Referring Physician: Mody Patient Identification: Melanie Reid MRN:  [id number] Principal Diagnosis: Acute delirium Diagnosis:   Patient Active Problem List   Diagnosis Date Noted  . Acute delirium [R41.0] 01/24/2018  . Cocaine abuse ([place]) [F14.10] 01/24/2018  . Altered mental status [R41.82] 01/23/2018  . Kidney stones [N20.0]     Total Time spent with patient: 1 hour  Subjective:   Melanie Reid is a 52 y.o. female patient admitted with "I wanted to have my knee looked at".  HPI: Patient interviewed chart reviewed.  I had seen this patient yesterday as well.  Interviewed her today in the intensive care unit.  This is a 52 year old woman brought in by her family with complaints that she had been asleep for longer than usual and was unarousable.  Patient had altered mental status on presentation.  Not able to answer any questions initially about any medicine usage.  History of alcohol abuse and apparently cocaine abuse.  Thought to be possibly an overdose of her prescription medicine and admitted to the hospital with a sitter in place.  On interview today I found the patient slightly more able to talk but still confused.  She knew she was in a hospital and knew the correct year but could not tell me why she was in the hospital and clearly had no memory of coming into the hospital.  Denied any memory of taking any excessive medicine extra medicine or drugs in addition to what she normally takes.  She currently denies any suicidal ideation but the patient is clearly still confused.  Made almost no eye contact.  Slurred her speech throughout.  Patient evidently is on antidepressant medication as well as muscle relaxers.  Social history: A daughter was present in the room at one point today.  Seems to  have some family support.  Medical history: History of kidney stones and chronic orthopedic pain issues.  Substance abuse history: Drug screen positive for cocaine.  Boyfriend had reported that they had been drinking although it is not clear how heavily she had been drinking.  There is nothing that I can see about specific substance abuse in the old chart.  Checking the controlled substance database there is no evidence of any controlled substances being prescribed for several years.  Past Psychiatric History: No known past psychiatric hospitalizations or suicide attempts.  Patient not able to give any other information.  She does however appear to be on antidepressant medication the underlying reason for that is still unclear.  Risk to Self:   Risk to Others:   Prior Inpatient Therapy:   Prior Outpatient Therapy:    Past Medical History:  Past Medical History:  Diagnosis Date  . Diverticulitis   . Kidney stones     Past Surgical History:  Procedure Laterality Date  . KNEE ARTHROSCOPY    . TUBAL LIGATION     Family History: No family history on file. Family Psychiatric  History: None known Social History:  Social History   Substance and Sexual Activity  Alcohol Use Yes     Social History   Substance and Sexual Activity  Drug Use Not on file    Social History   Socioeconomic History  . Marital status: Single    Spouse name: Not on file  . Number of children: Not on file  .  Years of education: Not on file  . Highest education level: Not on file  Occupational History  . Not on file  Social Needs  . Financial resource strain: Not on file  . Food insecurity:    Worry: Not on file    Inability: Not on file  . Transportation needs:    Medical: Not on file    Non-medical: Not on file  Tobacco Use  . Smoking status: Never Smoker  Substance and Sexual Activity  . Alcohol use: Yes  . Drug use: Not on file  . Sexual activity: Not on file  Lifestyle  . Physical  activity:    Days per week: Not on file    Minutes per session: Not on file  . Stress: Not on file  Relationships  . Social connections:    Talks on phone: Not on file    Gets together: Not on file    Attends religious service: Not on file    Active member of club or organization: Not on file    Attends meetings of clubs or organizations: Not on file    Relationship status: Not on file  Other Topics Concern  . Not on file  Social History Narrative  . Not on file   Additional Social History:    Allergies:  No Known Allergies  Labs:  Results for orders placed or performed during the hospital encounter of 01/23/18 (from the past 48 hour(s))  Urinalysis, Complete w Microscopic     Status: Abnormal   Collection Time: 01/23/18  6:38 PM  Result Value Ref Range   Color, Urine YELLOW (A) YELLOW   APPearance HAZY (A) CLEAR   Specific Gravity, Urine 1.016 1.005 - 1.030   pH 5.0 5.0 - 8.0   Glucose, UA NEGATIVE NEGATIVE mg/dL   Hgb urine dipstick LARGE (A) NEGATIVE   Bilirubin Urine NEGATIVE NEGATIVE   Ketones, ur 20 (A) NEGATIVE mg/dL   Protein, ur 30 (A) NEGATIVE mg/dL   Nitrite NEGATIVE NEGATIVE   Leukocytes, UA NEGATIVE NEGATIVE   RBC / HPF 0-5 0 - 5 RBC/hpf   WBC, UA 0-5 0 - 5 WBC/hpf   Bacteria, UA RARE (A) NONE SEEN   Squamous Epithelial / LPF 0-5 0 - 5   Mucus PRESENT    Hyaline Casts, UA PRESENT     Comment: Performed at [facility], [address] [place], [place] [address]  CBC with Differential     Status: Abnormal   Collection Time: 01/23/18  6:45 PM  Result Value Ref Range   WBC 7.1 3.6 - 11.0 K/uL   RBC 3.54 (L) 3.80 - 5.20 MIL/uL   Hemoglobin 11.9 (L) 12.0 - 16.0 g/dL   HCT 34.0 (L) 35.0 - 47.0 %   MCV 96.1 80.0 - 100.0 fL   MCH 33.7 26.0 - 34.0 pg   MCHC 35.1 32.0 - 36.0 g/dL   RDW 14.1 11.5 - 14.5 %   Platelets 178 150 - 440 K/uL   Neutrophils Relative % 85 %   Neutro Abs 6.1 1.4 - 6.5 K/uL   Lymphocytes Relative 9 %   Lymphs Abs 0.6 (L)  1.0 - 3.6 K/uL   Monocytes Relative 6 %   Monocytes Absolute 0.5 0.2 - 0.9 K/uL   Eosinophils Relative 0 %   Eosinophils Absolute 0.0 0 - 0.7 K/uL   Basophils Relative 0 %   Basophils Absolute 0.0 0 - 0.1 K/uL    Comment: Performed at [facility], [address] [place], [place] [address]  Comprehensive metabolic panel     Status: Abnormal   Collection Time: 01/23/18  6:45 PM  Result Value Ref Range   Sodium 137 135 - 145 mmol/L   Potassium 4.3 3.5 - 5.1 mmol/L   Chloride 100 98 - 111 mmol/L   CO2 21 (L) 22 - 32 mmol/L   Glucose, Bld 132 (H) 70 - 99 mg/dL   BUN 51 (H) 6 - 20 mg/dL   Creatinine, Ser 2.27 (H) 0.44 - 1.00 mg/dL   Calcium 8.8 (L) 8.9 - 10.3 mg/dL   Total Protein 7.5 6.5 - 8.1 g/dL   Albumin 4.3 3.5 - 5.0 g/dL   AST 139 (H) 15 - 41 U/L   ALT 80 (H) 0 - 44 U/L   Alkaline Phosphatase 83 38 - 126 U/L   Total Bilirubin 1.5 (H) 0.3 - 1.2 mg/dL   GFR calc non Af Amer 24 (L) >60 mL/min   GFR calc Af Amer 28 (L) >60 mL/min    Comment: (NOTE) The eGFR has been calculated using the CKD EPI equation. This calculation has not been validated in all clinical situations. eGFR's persistently <60 mL/min signify possible Chronic Kidney Disease.    Anion gap 16 (H) 5 - 15    Comment: Performed at [facility], [address] [place], [place] [address]  Troponin I     Status: None   Collection Time: 01/23/18  6:45 PM  Result Value Ref Range   Troponin I <0.03 <0.03 ng/mL    Comment: Performed at [facility], [address] [place], [place] [address]  Blood gas, venous     Status: Abnormal   Collection Time: 01/23/18  6:45 PM  Result Value Ref Range   pH, Ven 7.37 7.250 - 7.430   pCO2, Ven 33 (L) 44.0 - 60.0 mmHg   pO2, Ven 35.0 32.0 - 45.0 mmHg   Bicarbonate 19.1 (L) 20.0 - 28.0 mmol/L   Acid-base deficit 5.4 (H) 0.0 - 2.0 mmol/L   O2 Saturation 65.1 %   Patient temperature 37.0    Collection site VENOUS    Sample type VENOUS     Comment:  Performed at [facility], [address] [place], [place] [address]  Urine Drug Screen, Qualitative ([facility] only)     Status: Abnormal   Collection Time: 01/23/18  6:45 PM  Result Value Ref Range   Tricyclic, Ur Screen POSITIVE (A) NONE DETECTED   Amphetamines, Ur Screen NONE DETECTED NONE DETECTED   MDMA (Ecstasy)Ur Screen NONE DETECTED NONE DETECTED   Cocaine Metabolite,Ur [place] POSITIVE (A) NONE DETECTED   Opiate, Ur Screen NONE DETECTED NONE DETECTED   Phencyclidine (PCP) Ur S NONE DETECTED NONE DETECTED   Cannabinoid 50 Ng, Ur [place] NONE DETECTED NONE DETECTED   Barbiturates, Ur Screen NONE DETECTED NONE DETECTED   Benzodiazepine, Ur Scrn TEST NOT PERFORMED, REAGENT NOT AVAILABLE (A) NONE DETECTED   Methadone Scn, Ur NONE DETECTED NONE DETECTED    Comment: (NOTE) Tricyclics + metabolites, urine    Cutoff 1000 ng/mL Amphetamines + metabolites, urine  Cutoff 1000 ng/mL MDMA (Ecstasy), urine              Cutoff 500 ng/mL Cocaine Metabolite, urine          Cutoff 300 ng/mL Opiate + metabolites, urine        Cutoff 300 ng/mL Phencyclidine (PCP), urine         Cutoff 25 ng/mL Cannabinoid,  urine                 Cutoff 50 ng/mL Barbiturates + metabolites, urine  Cutoff 200 ng/mL Benzodiazepine, urine              Cutoff 200 ng/mL Methadone, urine                   Cutoff 300 ng/mL The urine drug screen provides only a preliminary, unconfirmed analytical test result and should not be used for non-medical purposes. Clinical consideration and professional judgment should be applied to any positive drug screen result due to possible interfering substances. A more specific alternate chemical method must be used in order to obtain a confirmed analytical result. Gas chromatography / mass spectrometry (GC/MS) is the preferred confirmat ory method. Performed at [facility], [address] [place], [place] [address]   Ethanol     Status: None   Collection Time: 01/23/18  6:45  PM  Result Value Ref Range   Alcohol, Ethyl (B) <10 <10 mg/dL    Comment: (NOTE) Lowest detectable limit for serum alcohol is 10 mg/dL. For medical purposes only. Performed at [facility], [address] [place], [place] [address]   Acetaminophen level     Status: Abnormal   Collection Time: 01/23/18  6:45 PM  Result Value Ref Range   Acetaminophen (Tylenol), Serum <10 (L) 10 - 30 ug/mL    Comment: (NOTE) Therapeutic concentrations vary significantly. A range of 10-30 ug/mL  may be an effective concentration for many patients. However, some  are best treated at concentrations outside of this range. Acetaminophen concentrations >150 ug/mL at 4 hours after ingestion  and >50 ug/mL at 12 hours after ingestion are often associated with  toxic reactions. Performed at [facility], [address] [place], [place] [address]   Salicylate level     Status: None   Collection Time: 01/23/18  6:45 PM  Result Value Ref Range   Salicylate Lvl <9.1 2.8 - 30.0 mg/dL    Comment: Performed at [facility], [address] [place], [place] [address]  Blood gas, arterial     Status: Abnormal   Collection Time: 01/23/18 10:16 PM  Result Value Ref Range   FIO2 0.40    Delivery systems VENTILATOR    Mode ASSIST CONTROL    VT 430 mL   Peep/cpap 5.0 cm H20   pH, Arterial 7.45 7.350 - 7.450   pCO2 arterial 28 (L) 32.0 - 48.0 mmHg   pO2, Arterial 153 (H) 83.0 - 108.0 mmHg   Bicarbonate 19.5 (L) 20.0 - 28.0 mmol/L   Acid-base deficit 2.7 (H) 0.0 - 2.0 mmol/L   O2 Saturation 99.4 %   Patient temperature 37.0    Collection site RIGHT RADIAL    Sample type ARTERIAL DRAW    Allens test (pass/fail) PASS PASS   Mechanical Rate 18     Comment: Performed at [facility], [address] [place], [place] [address]  Glucose, capillary     Status: Abnormal   Collection Time: 01/23/18 11:06 PM  Result Value Ref Range   Glucose-Capillary 221 (H) 70 - 99 mg/dL   HIV antibody (Routine Testing)     Status: None   Collection Time: 01/23/18 11:32 PM  Result Value Ref Range   HIV Screen 4th Generation wRfx Non Reactive Non Reactive    Comment: (NOTE) Performed At: [facility] [place], [place] [id number] Rush Farmer MD  HT:[id number]   Triglycerides     Status: Abnormal   Collection Time: 01/23/18 11:32 PM  Result Value Ref Range   Triglycerides 168 (H) <150 mg/dL    Comment: Performed at [facility], [address] [place], [place] [address]  MRSA PCR Screening     Status: None   Collection Time: 01/24/18 12:06 AM  Result Value Ref Range   MRSA by PCR NEGATIVE NEGATIVE    Comment:        The GeneXpert MRSA Assay (FDA approved for NASAL specimens only), is one component of a comprehensive MRSA colonization surveillance program. It is not intended to diagnose MRSA infection nor to guide or monitor treatment for MRSA infections. Performed at [facility], [address] [place], [place] [address]   Phosphorus     Status: None   Collection Time: 01/24/18  1:03 AM  Result Value Ref Range   Phosphorus 4.6 2.5 - 4.6 mg/dL    Comment: Performed at [facility], [address] [place], [place] [address]  Magnesium     Status: None   Collection Time: 01/24/18  1:03 AM  Result Value Ref Range   Magnesium 2.1 1.7 - 2.4 mg/dL    Comment: Performed at [facility], [address] [place], [place] [address]  Lactic acid, plasma     Status: None   Collection Time: 01/24/18  1:03 AM  Result Value Ref Range   Lactic Acid, Venous 1.3 0.5 - 1.9 mmol/L    Comment: Performed at [facility], [address] [place], [place] [address]  CK     Status: Abnormal   Collection Time: 01/24/18  1:03 AM  Result Value Ref Range   Total CK 9,133 (H) 38 - 234 U/L    Comment: RESULT CONFIRMED BY MANUAL DILUTION / FLC Performed at [facility], [address]  [place], [place] [address]   Basic metabolic panel     Status: Abnormal   Collection Time: 01/24/18  1:04 AM  Result Value Ref Range   Sodium 138 135 - 145 mmol/L   Potassium 3.5 3.5 - 5.1 mmol/L   Chloride 100 98 - 111 mmol/L   CO2 28 22 - 32 mmol/L   Glucose, Bld 153 (H) 70 - 99 mg/dL   BUN 48 (H) 6 - 20 mg/dL   Creatinine, Ser 1.92 (H) 0.44 - 1.00 mg/dL   Calcium 7.6 (L) 8.9 - 10.3 mg/dL   GFR calc non Af Amer 29 (L) >60 mL/min   GFR calc Af Amer 34 (L) >60 mL/min    Comment: (NOTE) The eGFR has been calculated using the CKD EPI equation. This calculation has not been validated in all clinical situations. eGFR's persistently <60 mL/min signify possible Chronic Kidney Disease.    Anion gap 10 5 - 15    Comment: Performed at [facility], [address] [place], [place] [address]  CBC     Status: Abnormal   Collection Time: 01/24/18  1:04 AM  Result Value Ref Range   WBC 5.6 3.6 - 11.0 K/uL   RBC 3.40 (L) 3.80 - 5.20 MIL/uL   Hemoglobin 11.2 (L) 12.0 - 16.0 g/dL   HCT 32.3 (L) 35.0 - 47.0 %   MCV 95.0 80.0 - 100.0 fL   MCH 33.0 26.0 - 34.0 pg   MCHC 34.7 32.0 - 36.0 g/dL   RDW 14.2 11.5 - 14.5 %   Platelets 136 (L) 150 - 440 K/uL  Comment: Performed at [facility], [address] [place], [place] [address]  Glucose, capillary     Status: Abnormal   Collection Time: 01/24/18  3:48 AM  Result Value Ref Range   Glucose-Capillary 147 (H) 70 - 99 mg/dL  Basic metabolic panel     Status: Abnormal   Collection Time: 01/24/18 12:52 PM  Result Value Ref Range   Sodium 138 135 - 145 mmol/L   Potassium 3.0 (L) 3.5 - 5.1 mmol/L   Chloride 99 98 - 111 mmol/L   CO2 31 22 - 32 mmol/L   Glucose, Bld 161 (H) 70 - 99 mg/dL   BUN 43 (H) 6 - 20 mg/dL   Creatinine, Ser 1.65 (H) 0.44 - 1.00 mg/dL   Calcium 7.1 (L) 8.9 - 10.3 mg/dL   GFR calc non Af Amer 35 (L) >60 mL/min   GFR calc Af Amer 41 (L) >60 mL/min    Comment: (NOTE) The eGFR has been calculated using the  CKD EPI equation. This calculation has not been validated in all clinical situations. eGFR's persistently <60 mL/min signify possible Chronic Kidney Disease.    Anion gap 8 5 - 15    Comment: Performed at [facility], [address] [place], [place] [address]  CK     Status: Abnormal   Collection Time: 01/24/18 12:52 PM  Result Value Ref Range   Total CK 4,603 (H) 38 - 234 U/L    Comment: RESULT CONFIRMED BY MANUAL DILUTION.MSS Performed at [facility], [address] [place], [place] [address]   Magnesium     Status: None   Collection Time: 01/24/18 12:52 PM  Result Value Ref Range   Magnesium 2.1 1.7 - 2.4 mg/dL    Comment: Performed at [facility], [address] [place], [place] [address]  Glucose, capillary     Status: Abnormal   Collection Time: 01/24/18  5:06 PM  Result Value Ref Range   Glucose-Capillary 129 (H) 70 - 99 mg/dL  Glucose, capillary     Status: Abnormal   Collection Time: 01/24/18 10:37 PM  Result Value Ref Range   Glucose-Capillary 105 (H) 70 - 99 mg/dL  Glucose, capillary     Status: Abnormal   Collection Time: 01/25/18  8:07 AM  Result Value Ref Range   Glucose-Capillary 111 (H) 70 - 99 mg/dL  Basic metabolic panel     Status: Abnormal   Collection Time: 01/25/18  9:48 AM  Result Value Ref Range   Sodium 135 135 - 145 mmol/L   Potassium 3.2 (L) 3.5 - 5.1 mmol/L   Chloride 101 98 - 111 mmol/L   CO2 27 22 - 32 mmol/L   Glucose, Bld 108 (H) 70 - 99 mg/dL   BUN 27 (H) 6 - 20 mg/dL   Creatinine, Ser 1.57 (H) 0.44 - 1.00 mg/dL   Calcium 7.8 (L) 8.9 - 10.3 mg/dL   GFR calc non Af Amer 37 (L) >60 mL/min   GFR calc Af Amer 43 (L) >60 mL/min    Comment: (NOTE) The eGFR has been calculated using the CKD EPI equation. This calculation has not been validated in all clinical situations. eGFR's persistently <60 mL/min signify possible Chronic Kidney Disease.    Anion gap 7 5 - 15    Comment: Performed at [facility], [address] [place], [place] [address]  Magnesium     Status: None   Collection Time: 01/25/18  9:48 AM  Result Value  Ref Range   Magnesium 2.0 1.7 - 2.4 mg/dL    Comment: Performed at [facility], [address] [place], [place] [address]  Phosphorus     Status: Abnormal   Collection Time: 01/25/18  9:48 AM  Result Value Ref Range   Phosphorus 1.7 (L) 2.5 - 4.6 mg/dL    Comment: Performed at [facility], [address] [place], [place] [address]  Albumin     Status: Abnormal   Collection Time: 01/25/18  9:48 AM  Result Value Ref Range   Albumin 3.2 (L) 3.5 - 5.0 g/dL    Comment: Performed at [facility], [address] [place], [place] [address]  CK     Status: Abnormal   Collection Time: 01/25/18  9:48 AM  Result Value Ref Range   Total CK 2,821 (H) 38 - 234 U/L    Comment: Performed at [facility], [address] [place], [place] [address]    Current Facility-Administered Medications  Medication Dose Route Frequency Provider Last Rate Last Dose  . acetaminophen (TYLENOL) tablet 650 mg  650 mg Oral Q6H PRN Mayo, Pete Pelt, MD       Or  . acetaminophen (TYLENOL) suppository 650 mg  650 mg Rectal Q6H PRN Mayo, Pete Pelt, MD      . chlorhexidine gluconate (MEDLINE KIT) (PERIDEX) 0.12 % solution 15 mL  15 mL Mouth Rinse BID Flora Lipps, MD   15 mL at 01/25/18 0800  . dextrose 5 %-0.45 % sodium chloride infusion   Intravenous Continuous Conforti, John, DO 50 mL/hr at 01/25/18 1304    . enoxaparin (LOVENOX) injection 40 mg  40 mg Subcutaneous Q24H Hillary Bow, MD   40 mg at 01/24/18 2244  . famotidine (PEPCID) IVPB 20 mg premix  20 mg Intravenous Daily Flora Lipps, MD   Stopped at 01/25/18 1130  . hydrALAZINE (APRESOLINE) injection 5 mg  5 mg Intravenous Q4H PRN Mayo, Pete Pelt, MD      . LORazepam (ATIVAN) injection 2-4 mg  2-4 mg Intravenous Q1H PRN Flora Lipps, MD   2 mg at 01/24/18 0211  . potassium PHOSPHATE 20 mmol in  dextrose 5 % 500 mL infusion  20 mmol Intravenous Once Bettey Costa, MD 84 mL/hr at 01/25/18 1255 20 mmol at 01/25/18 1255    Musculoskeletal: Strength & Muscle Tone: decreased Gait & Station: unable to stand Patient leans: N/A  Psychiatric Specialty Exam: Physical Exam  Nursing note and vitals reviewed. Constitutional: She appears well-developed and well-nourished.  HENT:  Head: Normocephalic and atraumatic.  Eyes: Pupils are equal, round, and reactive to light. Conjunctivae are normal.  Neck: Normal range of motion.  Cardiovascular: Regular rhythm and normal heart sounds.  Respiratory: Effort normal.  GI: Soft.  Musculoskeletal: Normal range of motion.  Skin: Skin is warm and dry.  Psychiatric: Her affect is blunt. Her speech is delayed. She is not agitated and not aggressive. Cognition and memory are impaired. She expresses inappropriate judgment. She expresses no suicidal ideation. She is noncommunicative. She exhibits abnormal recent memory and abnormal remote memory.    Review of Systems  Unable to perform ROS: Mental status change    Blood pressure 118/81, pulse 97, temperature 98.7 F (37.1 C), temperature source Oral, resp. rate (!) 21, height '5\' 4"'  (1.626 m), weight 71.1 kg (156 lb 12 oz), SpO2 97 %.Body mass index is 26.91 kg/m.  General Appearance: Disheveled  Eye Contact:  Minimal  Speech:  Garbled and Slurred  Volume:  Decreased  Mood:  Dysphoric  Affect:  Constricted  Thought Process:  Disorganized  Orientation:  Negative  Thought Content:  Illogical and Tangential  Suicidal Thoughts:  No  Homicidal Thoughts:  No  Memory:  Immediate;   Fair Recent;   Poor Remote;   Poor  Judgement:  Impaired  Insight:  Shallow  Psychomotor Activity:  Decreased  Concentration:  Concentration: Poor  Recall:  Poor  Fund of Knowledge:  Poor  Language:  Poor  Akathisia:  No  Handed:  Right  AIMS (if indicated):     Assets:  Social Support  ADL's:  Impaired  Cognition:   Impaired,  Moderate  Sleep:        Treatment Plan Summary: Plan Patient when I saw her this morning continued to be delirious and unable to engage in a appropriate interview.  Although she denied suicidal ideation she also had no memory of what brought her into the hospital and I do not consider this to be reliable enough to discontinue the IV C and the sitter yet.  No indication to change or start any psychiatric medicine.  I will follow-up tomorrow to see if she is finally getting to be lucid enough to have a conversation.  Continue IV C for now.  Disposition: See previous note under treatment plan summary.  Reevaluate tomorrow.  Alethia Berthold, MD 01/25/2018 2:57 PM

## 2018-01-25 NOTE — Progress Notes (Signed)
ANTIBIOTIC CONSULT NOTE - INITIAL  Pharmacy Consult for Zosyn  Indication: wound infection  No Known Allergies  Patient Measurements: Height: 5\' 4"  (162.6 cm) Weight: 156 lb 12 oz (71.1 kg) IBW/kg (Calculated) : 54.7 Adjusted Body Weight:   Vital Signs: Temp: 98.7 F (37.1 C) (08/03 1425) Temp Source: Oral (08/03 1425) BP: 106/71 (08/03 1425) Pulse Rate: 97 (08/03 1425) Intake/Output from previous day: 08/02 0701 - 08/03 0700 In: 2559.7 [I.V.:1658; IV Piggyback:901.7] Out: 1725 [Urine:1725] Intake/Output from this shift: Total I/O In: 1088.3 [P.O.:240; I.V.:235; IV Piggyback:613.3] Out: 575 [Urine:575]  Labs: Recent Labs    01/23/18 1845 01/24/18 0104 01/24/18 1252 01/25/18 0948  WBC 7.1 5.6  --   --   HGB 11.9* 11.2*  --   --   PLT 178 136*  --   --   CREATININE 2.27* 1.92* 1.65* 1.57*   Estimated Creatinine Clearance: 41.5 mL/min (A) (by C-G formula based on SCr of 1.57 mg/dL (H)). No results for input(s): VANCOTROUGH, VANCOPEAK, VANCORANDOM, GENTTROUGH, GENTPEAK, GENTRANDOM, TOBRATROUGH, TOBRAPEAK, TOBRARND, AMIKACINPEAK, AMIKACINTROU, AMIKACIN in the last 72 hours.   Microbiology: Recent Results (from the past 720 hour(s))  MRSA PCR Screening     Status: None   Collection Time: 01/24/18 12:06 AM  Result Value Ref Range Status   MRSA by PCR NEGATIVE NEGATIVE Final    Comment:        The GeneXpert MRSA Assay (FDA approved for NASAL specimens only), is one component of a comprehensive MRSA colonization surveillance program. It is not intended to diagnose MRSA infection nor to guide or monitor treatment for MRSA infections. Performed at [facility], [address] [place], [place] [address]     Medical History: Past Medical History:  Diagnosis Date  . Diverticulitis   . Kidney stones     Medications:  Medications Prior to Admission  Medication Sig Dispense Refill Last Dose  . amitriptyline (ELAVIL) 25 MG tablet Take 25 mg by mouth  at bedtime.     . chlorproMAZINE (THORAZINE) 25 MG tablet Take 12.5 mg by mouth daily.      . chlorthalidone (HYGROTON) 25 MG tablet Take 25 mg by mouth daily.     . DULoxetine (CYMBALTA) 30 MG capsule Take 30 mg by mouth daily.     . pantoprazole (PROTONIX) 40 MG tablet Take 40 mg by mouth daily.     Marland Kitchen. tiZANidine (ZANAFLEX) 2 MG tablet Take 2 mg by mouth 2 (two) times daily.     Marland Kitchen. HYDROcodone-acetaminophen (NORCO/VICODIN) 5-325 MG per tablet Take 1-2 tablets by mouth every 6 (six) hours as needed for pain. (Patient not taking: Reported on 01/23/2018) 12 tablet 0 Not Taking at Unknown time  . ondansetron (ZOFRAN ODT) 4 MG disintegrating tablet Take 1 tablet (4 mg total) by mouth every 8 (eight) hours as needed for nausea. (Patient not taking: Reported on 01/23/2018) 10 tablet 0 Not Taking at Unknown time   Assessment: CrCl = 41.5 ml/min  Goal of Therapy:  resolution of infection  Plan:  Expected duration 7 days with resolution of temperature and/or normalization of WBC   Zosyn 3.375 gm IV Q8H EI ordered to start on 8/3 @ 16:30.   Rogelio Waynick D 01/25/2018,5:11 PM

## 2018-01-25 NOTE — Progress Notes (Signed)
[facility] [place] [facility]   PATIENT NAME: Melanie Reid    MR#:  [id number]  DATE OF BIRTH:  11/28/1965  SUBJECTIVE:   Patient still confused this morning.  According to nurse she was complaining of arm numbness.  REVIEW OF SYSTEMS:  Unable to obtain as patient seems confused this morning  Tolerating Diet: yes      DRUG ALLERGIES:  No Known Allergies  VITALS:  Blood pressure 120/74, pulse 91, temperature 98.6 F (37 C), temperature source Oral, resp. rate (!) 25, height 5\' 4"  (1.626 m), weight 156 lb 12 oz (71.1 kg), SpO2 96 %.  PHYSICAL EXAMINATION:  Constitutional: Appears well-developed and well-nourished. No distress. HENT: Normocephalic. Marland Kitchen Oropharynx is clear and moist.  Eyes: Conjunctivae and EOM are normal. PERRLA, no scleral icterus.  Neck: Normal ROM. Neck supple. No JVD. No tracheal deviation. CVS: RRR, S1/S2 +, no murmurs, no gallops, no carotid bruit.  Pulmonary: Effort and breath sounds normal, no stridor, rhonchi, wheezes, rales.  Abdominal: Soft. BS +,  no distension, tenderness, rebound or guarding.  Musculoskeletal: Normal range of motion. No edema and no tenderness.  Neuro: Alert. CN 2-12 grossly intact. No focal deficits. Perhaps some slurred speech Skin: Skin is warm and dry. No rash noted. Psychiatric: Confused   LABORATORY PANEL:   CBC Recent Labs  Lab 01/24/18 0104  WBC 5.6  HGB 11.2*  HCT 32.3*  PLT 136*   ------------------------------------------------------------------------------------------------------------------  Chemistries  Recent Labs  Lab 01/23/18 1845  01/25/18 0948  NA 137   < > 135  K 4.3   < > 3.2*  CL 100   < > 101  CO2 21*   < > 27  GLUCOSE 132*   < > 108*  BUN 51*   < > 27*  CREATININE 2.27*   < > 1.57*  CALCIUM 8.8*   < > 7.8*  MG  --    < > 2.0  AST 139*  --   --   ALT 80*  --   --   ALKPHOS 83  --   --   BILITOT 1.5*  --   --    < > = values in this interval not displayed.    ------------------------------------------------------------------------------------------------------------------  Cardiac Enzymes Recent Labs  Lab 01/23/18 1845  TROPONINI <0.03   ------------------------------------------------------------------------------------------------------------------  RADIOLOGY:  Dg Chest 1 View  Result Date: 01/23/2018 CLINICAL DATA:  Altered mental status EXAM: CHEST  1 VIEW COMPARISON:  None. FINDINGS: Minimal atelectasis at the left base. Heart size within normal limits allowing for rotation. No pneumothorax. IMPRESSION: No active disease.  Minimal left base atelectasis Electronically Signed   By: Jasmine Pang M.D.   On: 01/23/2018 19:54   Dg Abd 1 View  Result Date: 01/24/2018 CLINICAL DATA:  Orogastric tube placement. EXAM: ABDOMEN - 1 VIEW COMPARISON:  Chest radiograph January 23, 2018 FINDINGS: Nasogastric tube tip projects in gastric antrum. LEFT lung base consolidation and small pleural effusion. Included bowel gas pattern is nondilated and nonobstructive. Soft tissue planes and included osseous structures are non suspicious. IMPRESSION: Nasogastric tube tip projects in gastric antrum. LEFT lung base consolidation and small pleural effusion. Electronically Signed   By: Awilda Metro M.D.   On: 01/24/2018 05:27   Ct Head Wo Contrast  Result Date: 01/25/2018 CLINICAL DATA:  Altered mental status.  Intubated. EXAM: CT HEAD WITHOUT CONTRAST TECHNIQUE: Contiguous axial images were obtained from the base of the skull through the vertex without intravenous contrast. COMPARISON:  01/23/2018 FINDINGS: Brain: Scattered minimal periventricular hypodensities compatible microvascular ischemic disease (representative image 19, series 3). Gray-white differentiation is otherwise well maintained. No CT evidence of acute large territory infarct. No intraparenchymal or extra-axial mass or hemorrhage. Normal size and configuration of the ventricles and the basilar  cisterns. No midline shift. Vascular: No hyperdense vessel or unexpected calcification. Skull: No displaced calvarial fracture. Sinuses/Orbits: Limited visualization the paranasal sinuses and mastoid air cells is normal. No air-fluid levels. Other: Regional soft tissues appear normal. IMPRESSION: Mild microvascular ischemic disease without superimposed acute intracranial process. Electronically Signed   By: Simonne ComeJohn  Watts M.D.   On: 01/25/2018 10:09   Ct Head Wo Contrast  Result Date: 01/23/2018 CLINICAL DATA:  Altered mental status EXAM: CT HEAD WITHOUT CONTRAST TECHNIQUE: Contiguous axial images were obtained from the base of the skull through the vertex without intravenous contrast. COMPARISON:  None. FINDINGS: Brain: No acute territorial infarction, hemorrhage or intracranial mass. Scattered hypodensity in the white matter likely small vessel disease. Mild atrophy. Nonenlarged ventricles Vascular: No hyperdense vessels.  No unexpected calcification Skull: Normal. Negative for fracture or focal lesion. Sinuses/Orbits: No acute finding. Other: None. IMPRESSION: 1. No CT evidence for acute intracranial abnormality. 2. Mild atrophy and small vessel ischemic changes of the white matter Electronically Signed   By: Jasmine PangKim  Fujinaga M.D.   On: 01/23/2018 19:01   Dg Chest Portable 1 View  Result Date: 01/23/2018 CLINICAL DATA:  Intubation EXAM: PORTABLE CHEST 1 VIEW COMPARISON:  01/23/2018 FINDINGS: Interval intubation, tip of the endotracheal tube is about 4.1 cm superior to the carina. Right lung is clear. Mild cardiomegaly. Streaky atelectasis at the left base. No pneumothorax. IMPRESSION: 1. Endotracheal tube tip about 4.1 cm superior to carina 2. Borderline cardiomegaly. Streaky atelectasis at the left lung base. Electronically Signed   By: Jasmine PangKim  Fujinaga M.D.   On: 01/23/2018 21:02     ASSESSMENT AND PLAN:   52 year old female with history of diverticulitis who presented to the emergency room due to  unresponsiveness. 1.  Acute hypoxic respiratory failure requiring brief intubation due to altered mental status: Patient is extubated and from a respiratory standpoint is doing well.  2.  Acute encephalopathy of unclear etiology: Patient was complaining of some numbness in her arm CT head negative for acute pathology MRI ordered Of note EMS did find empty bottles of amitriptyline, Zanaflex and Lexapro which is likely etiology of acute encephalopathy. Patient has been evaluated psychiatry.  Dr. Toni Amendlapacs is recommending to continue sitter. Urine toxicology was positive for cocaine and TCA  3.  Hypokalemia: Replete and recheck in a.m.  4.  Acute kidney injury which is improved with IV fluids  5.  Rhabdomyolysis: This is improved with IV fluids        Management plans discussed with nursing and dr clapacs  CODE STATUS: full  TOTAL TIME TAKING CARE OF THIS PATIENT: 27 minutes.     POSSIBLE D/C 2 days, DEPENDING ON CLINICAL CONDITION.   Azya Barbero M.D on 01/25/2018 at 12:17 PM  Between 7am to 6pm - Pager - [phone] After 6pm go to [email or web address] - password [email or web address]  [facility]  Office  [phone]  CC: Primary care physician; Armando GangLindley, Cheryl P, FNP  Note: This dictation was prepared with Dragon dictation along with smaller phrase technology. Any transcriptional errors that result from this process are unintentional.

## 2018-01-25 NOTE — Progress Notes (Signed)
Pharmacy Electrolyte Monitoring Consult:  Pharmacy consulted to assist in monitoring and replacing electrolytes in this 52 y.o. female admitted on 01/23/2018 with unresponsive  Patient is now extubated. Patient received potassium 40mEq VT x 1 and sodium bicarb infusion at 16650mL/hr.     Labs:  Sodium (mmol/L)  Date Value  01/25/2018 135   Potassium (mmol/L)  Date Value  01/25/2018 3.7   Magnesium (mg/dL)  Date Value  81/19/147808/08/2017 2.0   Phosphorus (mg/dL)  Date Value  29/56/213008/08/2017 1.7 (L)   Calcium (mg/dL)  Date Value  86/57/846908/08/2017 8.3 (L)   Albumin (g/dL)  Date Value  62/95/284108/08/2017 3.2 (L)    Assessment/Plan: Goal potassium ~4, goal magnesium ~2, goal phos > 2.5, goal calcium ~ 8.8.   Will recheck electrolytes with am labs.   01/25/2018 09:48 K 3.2, Ca 7.8, adjusted Ca 8.44, Phos 1.7, Mg 2.  Patient is currently NPO. Give the potassium 10 mEq IV Q1H x 3 doses CCM ordered. Also give potassium phosphate 20 mmol IV x 1. Recheck BMP this evening after repletion and recheck all electrolytes tomorrow with AM labs.   01/26/2018 21:00  K = 3.7,  Corrected Ca = 8.9, Mag and Phos not checked No additional electrolyte supplementation needed at this time.  Will recheck electrolytes on 8/4 with AM labs.   Pharmacy will continue to monitor and adjust per consult.   Melanie Reid D 01/25/2018 9:34 PM

## 2018-01-25 NOTE — Progress Notes (Addendum)
Pharmacy Electrolyte Monitoring Consult:  Pharmacy consulted to assist in monitoring and replacing electrolytes in this 52 y.o. female admitted on 01/23/2018 with unresponsive  Patient is now extubated. Patient received potassium 40mEq VT x 1 and sodium bicarb infusion at 14050mL/hr.     Labs:  Sodium (mmol/L)  Date Value  01/25/2018 135   Potassium (mmol/L)  Date Value  01/25/2018 3.2 (L)   Magnesium (mg/dL)  Date Value  14/78/295608/08/2017 2.0   Phosphorus (mg/dL)  Date Value  21/30/865708/08/2017 1.7 (L)   Calcium (mg/dL)  Date Value  84/69/629508/08/2017 7.8 (L)   Albumin (g/dL)  Date Value  28/41/324408/08/2017 3.2 (L)    Assessment/Plan: Goal potassium ~4, goal magnesium ~2, goal phos > 2.5, goal calcium ~ 8.8.   Will recheck electrolytes with am labs.   01/25/2018 09:48 K 3.2, Ca 7.8, adjusted Ca 8.44, Phos 1.7, Mg 2.  Patient is currently NPO. Give the potassium 10 mEq IV Q1H x 3 doses CCM ordered. Also give potassium phosphate 20 mmol IV x 1. Recheck BMP this evening after repletion and recheck all electrolytes tomorrow with AM labs.   Pharmacy will continue to monitor and adjust per consult.   Carola Frostathan A Sharlee Rufino 01/25/2018 10:41 AM

## 2018-01-25 NOTE — Progress Notes (Addendum)
Follow up - Critical Care Medicine Note  Patient Details:    Melanie Reid is an 52 y.o.femalewith a known history of diverticulitis and kidney stones presenting to the ED with altered mental status. Unable to obtain history from patient due to patient being intubated and altered. Per report, EMS was called by her boyfriend due to not waking up. Her and her boyfriend had been drinking last night. They went to sleep around 0230 last night and patient never woke up. EMS found empty bottles of Amitriptyline, Zanaflex, and Lexapro at her house. Patient was given Narcan and Flumazenil in the ED, with no improvement in mental status. She was intubated in the ED.   Lines, Airways, Drains: Airway 7.5 mm (Active)  Secured at (cm) 23 cm 01/24/2018  7:00 AM  Measured From Lips 01/24/2018  7:00 AM  Secured Location Right 01/24/2018  7:00 AM  Secured By [employer] 01/24/2018  7:00 AM  Tube Holder Repositioned Yes 01/24/2018  7:00 AM  Cuff Pressure (cm H2O) 28 cm H2O 01/24/2018  7:00 AM  Site Condition Dry 01/24/2018  7:00 AM     NG/OG Tube Orogastric Center mouth Xray Measured external length of tube 49 cm (Active)  External Length of Tube (cm) - (if applicable) 49 cm 01/24/1571 12:00 AM  Site Assessment Clean;Dry;Intact 01/24/2018  4:00 AM  Ongoing Placement Verification No change in cm markings or external length of tube from initial placement;No change in respiratory status;No acute changes, not attributed to clinical condition;Other (Comment) 01/24/2018  4:00 AM  Status Suction-low intermittent 01/24/2018  4:00 AM  Amount of suction 80 mmHg 01/24/2018  4:00 AM  Drainage Appearance Green;Brown 01/24/2018  4:00 AM  Intake (mL) 45 mL 01/24/2018  5:20 AM  Output (mL) 100 mL 01/24/2018  6:00 AM     Urethral Catheter Mayra, EDT Double-lumen 14 Fr. (Active)  Indication for Insertion or Continuance of Catheter Unstable critical patients (first 24-48 hours) 01/24/2018  4:00 AM  Site Assessment Clean;Intact;Dry 01/24/2018   4:00 AM  Catheter Maintenance Bag below level of bladder;Catheter secured;Drainage bag/tubing not touching floor;Insertion date on drainage bag;No dependent loops;Seal intact;Bag emptied prior to transport 01/24/2018  4:00 AM  Collection Container Standard drainage bag 01/24/2018  4:00 AM  Securement Method Securing device (Describe) 01/24/2018  4:00 AM  Output (mL) 45 mL 01/24/2018  6:00 AM    Anti-infectives:  Anti-infectives (From admission, onward)   None      Microbiology: Results for orders placed or performed during the hospital encounter of 01/23/18  MRSA PCR Screening     Status: None   Collection Time: 01/24/18 12:06 AM  Result Value Ref Range Status   MRSA by PCR NEGATIVE NEGATIVE Final    Comment:        The GeneXpert MRSA Assay (FDA approved for NASAL specimens only), is one component of a comprehensive MRSA colonization surveillance program. It is not intended to diagnose MRSA infection nor to guide or monitor treatment for MRSA infections. Performed at [facility], [address] [place], [place] [address]    Studies: Dg Chest 1 View  Result Date: 01/23/2018 CLINICAL DATA:  Altered mental status EXAM: CHEST  1 VIEW COMPARISON:  None. FINDINGS: Minimal atelectasis at the left base. Heart size within normal limits allowing for rotation. No pneumothorax. IMPRESSION: No active disease.  Minimal left base atelectasis Electronically Signed   By: Donavan Foil M.D.   On: 01/23/2018 19:54   Dg Abd 1 View  Result Date:  01/24/2018 CLINICAL DATA:  Orogastric tube placement. EXAM: ABDOMEN - 1 VIEW COMPARISON:  Chest radiograph January 23, 2018 FINDINGS: Nasogastric tube tip projects in gastric antrum. LEFT lung base consolidation and small pleural effusion. Included bowel gas pattern is nondilated and nonobstructive. Soft tissue planes and included osseous structures are non suspicious. IMPRESSION: Nasogastric tube tip projects in gastric antrum. LEFT lung base consolidation  and small pleural effusion. Electronically Signed   By: Awilda Metro M.D.   On: 01/24/2018 05:27   Ct Head Wo Contrast  Result Date: 01/23/2018 CLINICAL DATA:  Altered mental status EXAM: CT HEAD WITHOUT CONTRAST TECHNIQUE: Contiguous axial images were obtained from the base of the skull through the vertex without intravenous contrast. COMPARISON:  None. FINDINGS: Brain: No acute territorial infarction, hemorrhage or intracranial mass. Scattered hypodensity in the white matter likely small vessel disease. Mild atrophy. Nonenlarged ventricles Vascular: No hyperdense vessels.  No unexpected calcification Skull: Normal. Negative for fracture or focal lesion. Sinuses/Orbits: No acute finding. Other: None. IMPRESSION: 1. No CT evidence for acute intracranial abnormality. 2. Mild atrophy and small vessel ischemic changes of the white matter Electronically Signed   By: Jasmine Pang M.D.   On: 01/23/2018 19:01   Dg Chest Portable 1 View  Result Date: 01/23/2018 CLINICAL DATA:  Intubation EXAM: PORTABLE CHEST 1 VIEW COMPARISON:  01/23/2018 FINDINGS: Interval intubation, tip of the endotracheal tube is about 4.1 cm superior to the carina. Right lung is clear. Mild cardiomegaly. Streaky atelectasis at the left base. No pneumothorax. IMPRESSION: 1. Endotracheal tube tip about 4.1 cm superior to carina 2. Borderline cardiomegaly. Streaky atelectasis at the left lung base. Electronically Signed   By: Jasmine Pang M.D.   On: 01/23/2018 21:02    Consults: Treatment Team:  Audery Amel, MD   Subjective:    Overnight Issues: was extubated successfully yesterday. Bicarbonate infusion was stopped. Has had some slurred speech this morning with some facial asymmetry  Objective:  Vital signs for last 24 hours: Temp:  [98.2 F (36.8 C)-100 F (37.8 C)] 98.8 F (37.1 C) (08/03 0500) Pulse Rate:  [67-97] 86 (08/03 0600) Resp:  [10-26] 21 (08/03 0600) BP: (84-127)/(54-97) 111/76 (08/03 0600) SpO2:  [93 %-98  %] 96 % (08/03 0600)  Hemodynamic parameters for last 24 hours:    Intake/Output from previous day: 08/02 0701 - 08/03 0700 In: 2559.7 [I.V.:1658; IV Piggyback:901.7] Out: 1725 [Urine:1725]  Intake/Output this shift: No intake/output data recorded.  Vent settings for last 24 hours:    Physical Exam:  GENERAL:patient is sleepy but easily arousable and responds to questioning HEAD: Normocephalic, atraumatic.  EYES: Pupils equal, round, reactive to light.  No scleral icterus.  MOUTH: Moist mucosal membrane. NECK: Supple. No thyromegaly. No nodules. No JVD.  PULMONARY: rhonchi appreciated with central secretions CARDIOVASCULAR: S1 and S2. Regular rate and rhythm. No murmurs, rubs, or gallops.  GASTROINTESTINAL: Soft, nontender, -distended. No masses. Positive bowel sounds. No hepatosplenomegaly.  MUSCULOSKELETAL: No swelling, clubbing, or edema.  NEUROLOGIC: obtunded SKIN:intact,warm,dry    Assessment/Plan:   52 yo female with acute and severe resp failure from acute encephalopathy from acute Drug abuse(Cocaine and TCAD's), was successfully extubated yesterday. Somnolent but easily awakens. Pending psychiatric evaluation purposeful versus inadvertent overdose. Has had some slurred speech with facial asymmetry. For completeness will obtain CT scan of the head if negative will be stable for floor transfer with sitter  Renal Failure. Combination of rhabdomyolysis and acute tubular necrosis.CK is down to 4603 and BUN is 43 over creatinine  1.65  Hypokalemia. We'll replace    Jehan Ranganathan 01/25/2018  *Care during the described time interval was provided by me and/or other providers on the critical care team.  I have reviewed this patient's available data, including medical history, events of note, physical examination and test results as part of my evaluation. Patient ID: Gaspar BiddingGinger B Routh, female   DOB: [birth year], 52 y.o.   MRN: [id number]

## 2018-01-26 LAB — MAGNESIUM: Magnesium: 1.9 mg/dL (ref 1.7–2.4)

## 2018-01-26 LAB — GLUCOSE, CAPILLARY
Glucose-Capillary: 104 mg/dL — ABNORMAL HIGH (ref 70–99)
Glucose-Capillary: 113 mg/dL — ABNORMAL HIGH (ref 70–99)
Glucose-Capillary: 96 mg/dL (ref 70–99)

## 2018-01-26 LAB — CBC
HCT: 34.2 % — ABNORMAL LOW (ref 35.0–47.0)
Hemoglobin: 12.1 g/dL (ref 12.0–16.0)
MCH: 34 pg (ref 26.0–34.0)
MCHC: 35.3 g/dL (ref 32.0–36.0)
MCV: 96.2 fL (ref 80.0–100.0)
PLATELETS: 134 10*3/uL — AB (ref 150–440)
RBC: 3.56 MIL/uL — ABNORMAL LOW (ref 3.80–5.20)
RDW: 14.1 % (ref 11.5–14.5)
WBC: 5.3 10*3/uL (ref 3.6–11.0)

## 2018-01-26 LAB — BASIC METABOLIC PANEL
Anion gap: 8 (ref 5–15)
BUN: 18 mg/dL (ref 6–20)
CHLORIDE: 102 mmol/L (ref 98–111)
CO2: 27 mmol/L (ref 22–32)
Calcium: 8.3 mg/dL — ABNORMAL LOW (ref 8.9–10.3)
Creatinine, Ser: 1.39 mg/dL — ABNORMAL HIGH (ref 0.44–1.00)
GFR calc Af Amer: 50 mL/min — ABNORMAL LOW (ref >60)
GFR calc non Af Amer: 43 mL/min — ABNORMAL LOW (ref >60)
GLUCOSE: 105 mg/dL — AB (ref 70–99)
Potassium: 3.4 mmol/L — ABNORMAL LOW (ref 3.5–5.1)
Sodium: 137 mmol/L (ref 135–145)

## 2018-01-26 LAB — CK: Total CK: 2101 U/L — ABNORMAL HIGH (ref 38–234)

## 2018-01-26 LAB — PHOSPHORUS: Phosphorus: 2.5 mg/dL (ref 2.5–4.6)

## 2018-01-26 MED ORDER — APIXABAN 5 MG PO TABS: 10.0000 mg | ORAL_TABLET | Freq: Two times a day (BID) | ORAL | 0 refills | Status: DC

## 2018-01-26 MED ORDER — POTASSIUM CHLORIDE 20 MEQ/15ML (10%) PO SOLN
40.0000 meq | Freq: Once | ORAL | Status: AC
  Administered 2018-01-26: 09:00:00 40 meq via ORAL
  Filled 2018-01-26: qty 30

## 2018-01-26 MED ORDER — FAMOTIDINE 20 MG PO TABS
20.0000 mg | ORAL_TABLET | Freq: Every day | ORAL | Status: DC
  Administered 2018-01-26: 09:00:00 20 mg via ORAL
  Filled 2018-01-26: qty 1

## 2018-01-26 MED ORDER — APIXABAN 5 MG PO TABS: 5.0000 mg | ORAL_TABLET | Freq: Two times a day (BID) | ORAL | 0 refills | Status: DC

## 2018-01-26 MED ORDER — APIXABAN 5 MG PO TABS: 5.0000 mg | ORAL_TABLET | Freq: Two times a day (BID) | ORAL | Status: DC

## 2018-01-26 MED ORDER — APIXABAN 5 MG PO TABS
10.0000 mg | ORAL_TABLET | Freq: Two times a day (BID) | ORAL | Status: DC
  Administered 2018-01-26: 09:00:00 10 mg via ORAL
  Filled 2018-01-26: qty 2

## 2018-01-26 NOTE — Progress Notes (Signed)
Pharmacy Electrolyte Monitoring Consult:  Pharmacy consulted to assist in monitoring and replacing electrolytes in this 52 y.o. female admitted on 01/23/2018 with unresponsive  Patient is now extubated. Patient received potassium 40mEq VT x 1 and sodium bicarb infusion at 15650mL/hr.     Labs:  Sodium (mmol/L)  Date Value  01/26/2018 137   Potassium (mmol/L)  Date Value  01/26/2018 3.4 (L)   Magnesium (mg/dL)  Date Value  47/82/956208/09/2017 1.9   Phosphorus (mg/dL)  Date Value  13/08/657808/09/2017 2.5   Calcium (mg/dL)  Date Value  46/96/295208/09/2017 8.3 (L)   Albumin (g/dL)  Date Value  84/13/244008/08/2017 3.2 (L)    Assessment/Plan: Goal potassium ~4, goal magnesium ~2, goal phos > 2.5, goal calcium ~ 8.8.   Will recheck electrolytes with am labs.   01/25/2018 09:48 K 3.2, Ca 7.8, adjusted Ca 8.44, Phos 1.7, Mg 2.  Patient is currently NPO. Give the potassium 10 mEq IV Q1H x 3 doses CCM ordered. Also give potassium phosphate 20 mmol IV x 1. Recheck BMP this evening after repletion and recheck all electrolytes tomorrow with AM labs.   01/26/2018 04:52 K 3.4, Ca 8.3, Phos 2.5, Mg 1.9. Patient is currently on thins. Will order potassium chloride 10% oral solution 40 mEq po x 1 and will recheck all electrolytes tomorrow with AM labs.   Pharmacy will continue to monitor and adjust per consult.   Melanie Reid 01/26/2018 7:26 AM

## 2018-01-26 NOTE — Consult Note (Addendum)
ANTICOAGULATION CONSULT NOTE - Initial Consult  Pharmacy Consult for Apixaban Dosing  Indication: DVT  No Known Allergies  Patient Measurements: Height: 5\' 4"  (162.6 cm) Weight: 156 lb 12 oz (71.1 kg) IBW/kg (Calculated) : 54.7  Vital Signs: Temp: 98.4 F (36.9 C) (08/04 0448) Temp Source: Oral (08/04 0448) BP: 129/85 (08/04 0448) Pulse Rate: 85 (08/04 0448)  Labs: Recent Labs    01/23/18 1845  01/24/18 0104 01/24/18 1252 01/25/18 0948 01/25/18 2109 01/26/18 0452  HGB 11.9*  --  11.2*  --   --   --  12.1  HCT 34.0*  --  32.3*  --   --   --  34.2*  PLT 178  --  136*  --   --   --  134*  CREATININE 2.27*  --  1.92* 1.65* 1.57* 1.42* 1.39*  CKTOTAL  --    < >  --  4,603* 2,821*  --  2,101*  TROPONINI <0.03  --   --   --   --   --   --    < > = values in this interval not displayed.    Estimated Creatinine Clearance: 46.9 mL/min (A) (by C-G formula based on SCr of 1.39 mg/dL (H)).   Medical History: Past Medical History:  Diagnosis Date  . Diverticulitis   . Kidney stones     Assessment: Pharmacy consulted for Apixaban dosing and monitoring in 52 yo female admitted with overdose and resp failure. Patient transferred out of ICU on 8/3. Patient currently ordered enoxaparin 40mg  subq daily.   VENOUS LUE DOPPLER US: Positive for evidence of DVT in 1 of the paired brachial veins in the mid arm. The thrombus is fairly echogenic which suggests the possibility of subacute or chronic thrombus.  Plan:  Enoxaparin now discontinued.  Start apixaban 10mg  BID x 7 days, followed by apixaban 5mg  BID thereafter.  CBC ordered with AM labs.   Gardner CandleSheema M Elbert Polyakov, PharmD, BCPS Clinical Pharmacist 01/26/2018 8:33 AM

## 2018-01-26 NOTE — Progress Notes (Signed)
Pt being discharged home, discharge instructions and prescriptions reviewed with pt, states understanding, pt also states that she has no thoughts of harming herself, resources/info attached to discharge papers, pt with no complaints at discharge

## 2018-01-26 NOTE — Discharge Summary (Signed)
[facility]   PATIENT NAME: Madonna Flegal    MR#:  [id number]  DATE OF BIRTH:  May 30, 1966  DATE OF ADMISSION:  01/23/2018 ADMITTING PHYSICIAN: Campbell Stall, MD  DATE OF DISCHARGE: January 26, 2018  PRIMARY CARE PHYSICIAN: Armando Gang, FNP    ADMISSION DIAGNOSIS:  Cocaine use [F14.90] Drug overdose, undetermined intent, initial encounter [T50.904A] Acute renal failure, unspecified acute renal failure type (HCC) [N17.9] Altered mental status, unspecified altered mental status type [R41.82]  DISCHARGE DIAGNOSIS:  Principal Problem:   Acute delirium Active Problems:   Altered mental status   Cocaine abuse (HCC)   SECONDARY DIAGNOSIS:   Past Medical History:  Diagnosis Date  . Diverticulitis   . Kidney stones     HOSPITAL COURSE:  52 year old female with history of diverticulitis who presented to the emergency room due to unresponsiveness.   1.  Acute hypoxic respiratory failure requiring brief intubation due to altered mental status: Patient is extubated and from a respiratory standpoint is doing well.  2.  Acute encephalopathy possibly from empty bottles of amitriptyline, Zanaflex and Lexapro. Was evaluated by psychiatry while in the hospital.  Encephalopathy has improved and she is at baseline. She denies using cocaine however her urine toxicology was positive for cocaine.    3.  Hypokalemia: This has been repleted  4.  Acute kidney injury which is improved with IV fluids  5.  Rhabdomyolysis: This has improved with IV fluids  6.  Left arm DVT: Patient has been started on Eliquis.  Side effects, alternatives, risks and benefits were discussed with the patient and she accepts these risks not limited to bleeding.   DISCHARGE CONDITIONS AND DIET:   Stable for discharge on regular diet  CONSULTS OBTAINED:  Treatment Team:  Clapacs, Jackquline Denmark, MD  DRUG ALLERGIES:  No Known Allergies  DISCHARGE MEDICATIONS:    Allergies as of 01/26/2018   No Known Allergies     Medication List    STOP taking these medications   HYDROcodone-acetaminophen 5-325 MG tablet Commonly known as:  NORCO/VICODIN   ondansetron 4 MG disintegrating tablet Commonly known as:  ZOFRAN ODT     TAKE these medications   amitriptyline 25 MG tablet Commonly known as:  ELAVIL Take 25 mg by mouth at bedtime.   apixaban 5 MG Tabs tablet Commonly known as:  ELIQUIS Take 2 tablets (10 mg total) by mouth 2 (two) times daily for 6 days.   apixaban 5 MG Tabs tablet Commonly known as:  ELIQUIS Take 1 tablet (5 mg total) by mouth 2 (two) times daily. Start taking on:  02/02/2018   chlorproMAZINE 25 MG tablet Commonly known as:  THORAZINE Take 12.5 mg by mouth daily.   chlorthalidone 25 MG tablet Commonly known as:  HYGROTON Take 25 mg by mouth daily.   DULoxetine 30 MG capsule Commonly known as:  CYMBALTA Take 30 mg by mouth daily.   pantoprazole 40 MG tablet Commonly known as:  PROTONIX Take 40 mg by mouth daily.   tiZANidine 2 MG tablet Commonly known as:  ZANAFLEX Take 2 mg by mouth 2 (two) times daily.         Today   CHIEF COMPLAINT:  Patient is denying suicidal ideation or attempt.  Patient's mental status is back to baseline.   VITAL SIGNS:  Blood pressure 129/85, pulse 85, temperature 98.4 F (36.9 C), temperature source Oral, resp. rate 16, height 5\' 4"  (1.626 m), weight 156 lb 12 oz (  71.1 kg), SpO2 96 %.   REVIEW OF SYSTEMS:  Review of Systems  Constitutional: Negative.  Negative for chills, fever and malaise/fatigue.  HENT: Negative.  Negative for ear discharge, ear pain, hearing loss, nosebleeds and sore throat.   Eyes: Negative.  Negative for blurred vision and pain.  Respiratory: Negative.  Negative for cough, hemoptysis, shortness of breath and wheezing.   Cardiovascular: Negative.  Negative for chest pain, palpitations and leg swelling.  Gastrointestinal: Negative.  Negative for  abdominal pain, blood in stool, diarrhea, nausea and vomiting.  Genitourinary: Negative.  Negative for dysuria.  Musculoskeletal: Negative for back pain.       Left arm swelling  Skin: Negative.   Neurological: Negative for dizziness, tremors, speech change, focal weakness, seizures and headaches.  Endo/Heme/Allergies: Negative.  Does not bruise/bleed easily.  Psychiatric/Behavioral: Negative.  Negative for depression, hallucinations and suicidal ideas.     PHYSICAL EXAMINATION:  GENERAL:  52 y.o.-year-old patient lying in the bed with no acute distress.  NECK:  Supple, no jugular venous distention. No thyroid enlargement, no tenderness.  LUNGS: Normal breath sounds bilaterally, no wheezing, rales,rhonchi  No use of accessory muscles of respiration.  CARDIOVASCULAR: S1, S2 normal. No murmurs, rubs, or gallops.  ABDOMEN: Soft, non-tender, non-distended. Bowel sounds present. No organomegaly or mass.  EXTREMITIES: No pedal edema, cyanosis, or clubbing.  PSYCHIATRIC: The patient is alert and oriented x 3.  SKIN: .  Left arm with swelling and some blistering  DATA REVIEW:   CBC Recent Labs  Lab 01/26/18 0452  WBC 5.3  HGB 12.1  HCT 34.2*  PLT 134*    Chemistries  Recent Labs  Lab 01/23/18 1845  01/26/18 0452  NA 137   < > 137  K 4.3   < > 3.4*  CL 100   < > 102  CO2 21*   < > 27  GLUCOSE 132*   < > 105*  BUN 51*   < > 18  CREATININE 2.27*   < > 1.39*  CALCIUM 8.8*   < > 8.3*  MG  --    < > 1.9  AST 139*  --   --   ALT 80*  --   --   ALKPHOS 83  --   --   BILITOT 1.5*  --   --    < > = values in this interval not displayed.    Cardiac Enzymes Recent Labs  Lab 01/23/18 1845  TROPONINI <0.03    Microbiology Results  @[email or web address] @  RADIOLOGY:  Ct Head Wo Contrast  Result Date: 01/25/2018 CLINICAL DATA:  Altered mental status.  Intubated. EXAM: CT HEAD WITHOUT CONTRAST TECHNIQUE: Contiguous axial images were obtained from the base of the skull through the  vertex without intravenous contrast. COMPARISON:  01/23/2018 FINDINGS: Brain: Scattered minimal periventricular hypodensities compatible microvascular ischemic disease (representative image 19, series 3). Gray-white differentiation is otherwise well maintained. No CT evidence of acute large territory infarct. No intraparenchymal or extra-axial mass or hemorrhage. Normal size and configuration of the ventricles and the basilar cisterns. No midline shift. Vascular: No hyperdense vessel or unexpected calcification. Skull: No displaced calvarial fracture. Sinuses/Orbits: Limited visualization the paranasal sinuses and mastoid air cells is normal. No air-fluid levels. Other: Regional soft tissues appear normal. IMPRESSION: Mild microvascular ischemic disease without superimposed acute intracranial process. Electronically Signed   By: Simonne Come M.D.   On: 01/25/2018 10:09   Mr Brain Wo Contrast  Result Date: 01/25/2018 CLINICAL DATA:  Acute presentation  with altered mental status. Ventilator support. EXAM: MRI HEAD WITHOUT CONTRAST TECHNIQUE: Multiplanar, multiecho pulse sequences of the brain and surrounding structures were obtained without intravenous contrast. COMPARISON:  Head CT same day FINDINGS: Brain: Diffusion imaging does not show any acute or subacute infarction. The brainstem and cerebellum are normal. Within the cerebral hemispheres, there are scattered foci of T2 and FLAIR signal within the white matter most consistent with old small vessel ischemic changes. No cortical or large vessel territory infarction. No mass lesion, hemorrhage, hydrocephalus or extra-axial collection. Vascular: Major vessels at the base of the brain show flow. Skull and upper cervical spine: Negative Sinuses/Orbits: Sinuses are clear. Orbits are negative. Small mastoid effusion on the left. Other: None IMPRESSION: No acute or reversible process. Mild chronic small-vessel ischemic changes affecting the cerebral hemispheric white  matter. Small left mastoid effusion. Electronically Signed   By: Paulina Fusi M.D.   On: 01/25/2018 18:23   US Venous Img Upper Uni Left  Result Date: 01/26/2018 CLINICAL DATA:  52 year old female with left upper extremity swelling. EXAM: LEFT UPPER EXTREMITY VENOUS DOPPLER ULTRASOUND TECHNIQUE: Gray-scale sonography with graded compression, as well as color Doppler and duplex ultrasound were performed to evaluate the upper extremity deep venous system from the level of the subclavian vein and including the jugular, axillary, basilic, radial, ulnar and upper cephalic vein. Spectral Doppler was utilized to evaluate flow at rest and with distal augmentation maneuvers. COMPARISON:  None. FINDINGS: Contralateral Subclavian Vein: Respiratory phasicity is normal and symmetric with the symptomatic side. No evidence of thrombus. Normal compressibility. Internal Jugular Vein: No evidence of thrombus. Normal compressibility, respiratory phasicity and response to augmentation. Subclavian Vein: No evidence of thrombus. Normal compressibility, respiratory phasicity and response to augmentation. Axillary Vein: No evidence of thrombus. Normal compressibility, respiratory phasicity and response to augmentation. Cephalic Vein: No evidence of thrombus. Normal compressibility, respiratory phasicity and response to augmentation. Basilic Vein: No evidence of thrombus. Normal compressibility, respiratory phasicity and response to augmentation. Brachial Veins: 1 of the paired brachial veins is patent and compressible with evidence of flow on color Doppler imaging. However, the second vein is incompletely compressible in the mid arm and demonstrates echogenic material within the vessel lumen. Color flow is not identified on color Doppler imaging. Radial Veins: No evidence of thrombus. Normal compressibility, respiratory phasicity and response to augmentation. Ulnar Veins: No evidence of thrombus. Normal compressibility, respiratory  phasicity and response to augmentation. Venous Reflux:  None visualized. Other Findings:  None visualized. IMPRESSION: Positive for evidence of DVT in 1 of the paired brachial veins in the mid arm. The thrombus is fairly echogenic which suggests the possibility of subacute or chronic thrombus. Electronically Signed   By: Malachy Moan M.D.   On: 01/26/2018 07:58      Allergies as of 01/26/2018   No Known Allergies     Medication List    STOP taking these medications   HYDROcodone-acetaminophen 5-325 MG tablet Commonly known as:  NORCO/VICODIN   ondansetron 4 MG disintegrating tablet Commonly known as:  ZOFRAN ODT     TAKE these medications   amitriptyline 25 MG tablet Commonly known as:  ELAVIL Take 25 mg by mouth at bedtime.   apixaban 5 MG Tabs tablet Commonly known as:  ELIQUIS Take 2 tablets (10 mg total) by mouth 2 (two) times daily for 6 days.   apixaban 5 MG Tabs tablet Commonly known as:  ELIQUIS Take 1 tablet (5 mg total) by mouth 2 (two) times daily. Start taking  on:  02/02/2018   chlorproMAZINE 25 MG tablet Commonly known as:  THORAZINE Take 12.5 mg by mouth daily.   chlorthalidone 25 MG tablet Commonly known as:  HYGROTON Take 25 mg by mouth daily.   DULoxetine 30 MG capsule Commonly known as:  CYMBALTA Take 30 mg by mouth daily.   pantoprazole 40 MG tablet Commonly known as:  PROTONIX Take 40 mg by mouth daily.   tiZANidine 2 MG tablet Commonly known as:  ZANAFLEX Take 2 mg by mouth 2 (two) times daily.         Management plans discussed with the patient and she is in agreement. Stable for discharge   Patient should follow up with pcp  CODE STATUS:     Code Status Orders  (From admission, onward)        Start     Ordered   01/23/18 2309  Full code  Continuous     01/23/18 2309    Code Status History    This patient has a current code status but no historical code status.      TOTAL TIME TAKING CARE OF THIS PATIENT: 38  minutes.    Note: This dictation was prepared with Dragon dictation along with smaller phrase technology. Any transcriptional errors that result from this process are unintentional.  Jaryan Chicoine M.D on 01/26/2018 at 8:51 AM  Between 7am to 6pm - Pager - [phone] After 6pm go to [email or web address] - Social research officer, [email or web address] [email or web address]  [facility]  Office  [phone]  CC: Primary care physician; Armando GangLindley, Cheryl P, FNP

## 2018-01-26 NOTE — Consult Note (Signed)
[name] Face-to-Face Psychiatry Consult   Reason for Consult: Consult for this 52 year old woman who came into the hospital with altered mental status possible overdose Referring Physician: Mody Patient Identification: Melanie Reid MRN:  [id number] Principal Diagnosis: Acute delirium Diagnosis:   Patient Active Problem List   Diagnosis Date Noted  . Acute delirium [R41.0] 01/24/2018  . Cocaine abuse ([place]) [F14.10] 01/24/2018  . Altered mental status [R41.82] 01/23/2018  . Kidney stones [N20.0]     Total Time spent with patient: 1 hour  Subjective:   Melanie Reid is a 52 y.o. female patient admitted with "they said I took too many pills".  HPI: Patient interviewed chart reviewed.  See previous notes.  This 52 year old woman came to the hospital a couple of days ago with altered mental status and initial unresponsiveness.  Had to be treated temporarily in the ICU.  Had been unable to give any information that was very coherent for a couple of days.  On interview today the patient still seems to be a little sedated and sluggish and confused.  Nevertheless she knows where she is knows what year it is and basic short-term memory and concentration is adequate.  Patient claims to have no memory of why she is in the hospital.  She says that she knows that people have said she took too many pills but she does not remember anything about it.  She denies having been depressed recently denies having had any suicidal thoughts at all.  Patient denies any drug use recently.  Even when I presented her with the evidence of her cocaine positive drug screen.  Patient is denying any suicidal ideation now.  Denies any hallucinations.  Has not been agitated or acting dangerously here in the hospital.  Social history: Lives mostly by herself but stays with her boyfriend intermittently.  Does not work outside the home.  Very little activity.  Medical history: Chronic pain with no clear etiology to  it.  Substance abuse history: Patient denies substance abuse.  Drug screen positive for cocaine.  Denies that she had been drinking heavily.  Denies that she takes any other narcotics or pain medicines besides the "muscle relaxer" that she is prescribed.  Past Psychiatric History: Patient claims to have no past psychiatric history.  No history of suicide attempts or inpatient hospitalization.  Does not feel like she is ever had any substance abuse problems.  Risk to Self:   Risk to Others:   Prior Inpatient Therapy:   Prior Outpatient Therapy:    Past Medical History:  Past Medical History:  Diagnosis Date  . Diverticulitis   . Kidney stones     Past Surgical History:  Procedure Laterality Date  . KNEE ARTHROSCOPY    . TUBAL LIGATION     Family History: No family history on file. Family Psychiatric  History: Denies any Social History:  Social History   Substance and Sexual Activity  Alcohol Use Yes     Social History   Substance and Sexual Activity  Drug Use Not on file    Social History   Socioeconomic History  . Marital status: Single    Spouse name: Not on file  . Number of children: Not on file  . Years of education: Not on file  . Highest education level: Not on file  Occupational History  . Not on file  Social Needs  . Financial resource strain: Not on file  . Food insecurity:    Worry: Not on file  Inability: Not on file  . Transportation needs:    Medical: Not on file    Non-medical: Not on file  Tobacco Use  . Smoking status: Never Smoker  Substance and Sexual Activity  . Alcohol use: Yes  . Drug use: Not on file  . Sexual activity: Not on file  Lifestyle  . Physical activity:    Days per week: Not on file    Minutes per session: Not on file  . Stress: Not on file  Relationships  . Social connections:    Talks on phone: Not on file    Gets together: Not on file    Attends religious service: Not on file    Active member of club or  organization: Not on file    Attends meetings of clubs or organizations: Not on file    Relationship status: Not on file  Other Topics Concern  . Not on file  Social History Narrative  . Not on file   Additional Social History:    Allergies:  No Known Allergies  Labs:  Results for orders placed or performed during the hospital encounter of 01/23/18 (from the past 48 hour(s))  Glucose, capillary     Status: Abnormal   Collection Time: 01/24/18  5:06 PM  Result Value Ref Range   Glucose-Capillary 129 (H) 70 - 99 mg/dL  Glucose, capillary     Status: Abnormal   Collection Time: 01/24/18 10:37 PM  Result Value Ref Range   Glucose-Capillary 105 (H) 70 - 99 mg/dL  Glucose, capillary     Status: Abnormal   Collection Time: 01/25/18  8:07 AM  Result Value Ref Range   Glucose-Capillary 111 (H) 70 - 99 mg/dL  Basic metabolic panel     Status: Abnormal   Collection Time: 01/25/18  9:48 AM  Result Value Ref Range   Sodium 135 135 - 145 mmol/L   Potassium 3.2 (L) 3.5 - 5.1 mmol/L   Chloride 101 98 - 111 mmol/L   CO2 27 22 - 32 mmol/L   Glucose, Bld 108 (H) 70 - 99 mg/dL   BUN 27 (H) 6 - 20 mg/dL   Creatinine, Ser 1.57 (H) 0.44 - 1.00 mg/dL   Calcium 7.8 (L) 8.9 - 10.3 mg/dL   GFR calc non Af Amer 37 (L) >60 mL/min   GFR calc Af Amer 43 (L) >60 mL/min    Comment: (NOTE) The eGFR has been calculated using the CKD EPI equation. This calculation has not been validated in all clinical situations. eGFR's persistently <60 mL/min signify possible Chronic Kidney Disease.    Anion gap 7 5 - 15    Comment: Performed at [facility], [address] [place], [place] [address]  Magnesium     Status: None   Collection Time: 01/25/18  9:48 AM  Result Value Ref Range   Magnesium 2.0 1.7 - 2.4 mg/dL    Comment: Performed at [facility], [address] [place], [place] [address]  Phosphorus     Status: Abnormal   Collection Time: 01/25/18  9:48 AM  Result Value Ref Range    Phosphorus 1.7 (L) 2.5 - 4.6 mg/dL    Comment: Performed at [facility], [address] [place], [place] [address]  Albumin     Status: Abnormal   Collection Time: 01/25/18  9:48 AM  Result Value Ref Range   Albumin 3.2 (L) 3.5 - 5.0 g/dL    Comment: Performed at [facility], [place]  [address] [place], [place] [address]  CK     Status: Abnormal   Collection Time: 01/25/18  9:48 AM  Result Value Ref Range   Total CK 2,821 (H) 38 - 234 U/L    Comment: Performed at [facility], [address] [place], [place] [address]  Basic metabolic panel     Status: Abnormal   Collection Time: 01/25/18  9:09 PM  Result Value Ref Range   Sodium 135 135 - 145 mmol/L   Potassium 3.7 3.5 - 5.1 mmol/L   Chloride 100 98 - 111 mmol/L   CO2 25 22 - 32 mmol/L   Glucose, Bld 109 (H) 70 - 99 mg/dL   BUN 20 6 - 20 mg/dL   Creatinine, Ser 1.42 (H) 0.44 - 1.00 mg/dL   Calcium 8.3 (L) 8.9 - 10.3 mg/dL   GFR calc non Af Amer 42 (L) >60 mL/min   GFR calc Af Amer 49 (L) >60 mL/min    Comment: (NOTE) The eGFR has been calculated using the CKD EPI equation. This calculation has not been validated in all clinical situations. eGFR's persistently <60 mL/min signify possible Chronic Kidney Disease.    Anion gap 10 5 - 15    Comment: Performed at [facility], [address] [place], [place] [address]  Glucose, capillary     Status: None   Collection Time: 01/26/18 12:18 AM  Result Value Ref Range   Glucose-Capillary 96 70 - 99 mg/dL  Basic metabolic panel     Status: Abnormal   Collection Time: 01/26/18  4:52 AM  Result Value Ref Range   Sodium 137 135 - 145 mmol/L   Potassium 3.4 (L) 3.5 - 5.1 mmol/L   Chloride 102 98 - 111 mmol/L   CO2 27 22 - 32 mmol/L   Glucose, Bld 105 (H) 70 - 99 mg/dL   BUN 18 6 - 20 mg/dL   Creatinine, Ser 1.39 (H) 0.44 - 1.00 mg/dL   Calcium 8.3 (L) 8.9 - 10.3 mg/dL   GFR calc non Af Amer 43 (L) >60 mL/min   GFR calc Af Amer 50 (L) >60  mL/min    Comment: (NOTE) The eGFR has been calculated using the CKD EPI equation. This calculation has not been validated in all clinical situations. eGFR's persistently <60 mL/min signify possible Chronic Kidney Disease.    Anion gap 8 5 - 15    Comment: Performed at [facility], [address] [place], [place] [address]  Magnesium     Status: None   Collection Time: 01/26/18  4:52 AM  Result Value Ref Range   Magnesium 1.9 1.7 - 2.4 mg/dL    Comment: Performed at [facility], [address] [place], [place] [address]  Phosphorus     Status: None   Collection Time: 01/26/18  4:52 AM  Result Value Ref Range   Phosphorus 2.5 2.5 - 4.6 mg/dL    Comment: Performed at [facility], [address] [place], [place] [address]  CK     Status: Abnormal   Collection Time: 01/26/18  4:52 AM  Result Value Ref Range   Total CK 2,101 (H) 38 - 234 U/L    Comment: Performed at [facility], [address] [place], [place] [address]  CBC     Status: Abnormal   Collection Time: 01/26/18  4:52 AM  Result Value Ref Range   WBC 5.3 3.6 - 11.0 K/uL   RBC 3.56 (L) 3.80 -  5.20 MIL/uL   Hemoglobin 12.1 12.0 - 16.0 g/dL   HCT 34.2 (L) 35.0 - 47.0 %   MCV 96.2 80.0 - 100.0 fL   MCH 34.0 26.0 - 34.0 pg   MCHC 35.3 32.0 - 36.0 g/dL   RDW 14.1 11.5 - 14.5 %   Platelets 134 (L) 150 - 440 K/uL    Comment: Performed at [facility], [address] [place], [place] [address]  Glucose, capillary     Status: Abnormal   Collection Time: 01/26/18  7:41 AM  Result Value Ref Range   Glucose-Capillary 104 (H) 70 - 99 mg/dL  Glucose, capillary     Status: Abnormal   Collection Time: 01/26/18 12:14 PM  Result Value Ref Range   Glucose-Capillary 113 (H) 70 - 99 mg/dL    Current Facility-Administered Medications  Medication Dose Route Frequency Provider Last Rate Last Dose  . acetaminophen (TYLENOL) tablet 650 mg  650 mg Oral Q6H PRN Mayo, Pete Pelt, MD        Or  . acetaminophen (TYLENOL) suppository 650 mg  650 mg Rectal Q6H PRN Mayo, Pete Pelt, MD      . apixaban [facility]) tablet 10 mg  10 mg Oral BID Pernell Dupre, RPH   10 mg at 01/26/18 0856   Followed by  . [START ON 02/02/2018] apixaban (ELIQUIS) tablet 5 mg  5 mg Oral BID Hallaji, Dani Gobble, RPH      . chlorhexidine gluconate (MEDLINE KIT) (PERIDEX) 0.12 % solution 15 mL  15 mL Mouth Rinse BID Flora Lipps, MD   15 mL at 01/25/18 0800  . famotidine (PEPCID) tablet 20 mg  20 mg Oral Daily Pernell Dupre, RPH   20 mg at 01/26/18 0856  . hydrALAZINE (APRESOLINE) injection 5 mg  5 mg Intravenous Q4H PRN Mayo, Pete Pelt, MD      . LORazepam (ATIVAN) injection 2-4 mg  2-4 mg Intravenous Q1H PRN Flora Lipps, MD   2 mg at 01/24/18 0211    Musculoskeletal: Strength & Muscle Tone: decreased Gait & Station: unsteady Patient leans: N/A  Psychiatric Specialty Exam: Physical Exam  Nursing note and vitals reviewed. Constitutional: She appears well-developed and well-nourished.  HENT:  Head: Normocephalic and atraumatic.  Eyes: Pupils are equal, round, and reactive to light. Conjunctivae are normal.  Neck: Normal range of motion.  Cardiovascular: Regular rhythm and normal heart sounds.  Respiratory: Effort normal. No respiratory distress.  GI: Soft.  Musculoskeletal: Normal range of motion.  Neurological: She is alert.  Skin: Skin is warm and dry.  Psychiatric: Her affect is blunt. Her speech is delayed. She is slowed. Thought content is not paranoid. She expresses inappropriate judgment. She expresses no homicidal and no suicidal ideation. She exhibits abnormal recent memory and abnormal remote memory.    Review of Systems  Constitutional: Negative.   HENT: Negative.   Eyes: Negative.   Respiratory: Negative.   Cardiovascular: Negative.   Gastrointestinal: Negative.   Musculoskeletal: Negative.   Skin: Negative.   Neurological: Negative.   Psychiatric/Behavioral: Positive for  memory loss. Negative for depression, hallucinations, substance abuse and suicidal ideas. The patient is not nervous/anxious and does not have insomnia.     Blood pressure 129/85, pulse 85, temperature 97.8 F (36.6 C), temperature source Oral, resp. rate 18, height _0  (1.626 m), weight 71.1 kg (156 lb 12 oz), SpO2 96 %.Body mass index is 26.91 kg/m.  General Appearance: Disheveled  Eye Contact:  Fair  Speech:  Slow  Volume:  Decreased  Mood:  Euthymic  Affect:  Constricted  Thought Process:  Coherent  Orientation:  Full (Time, Place, and Person)  Thought Content:  Illogical  Suicidal Thoughts:  No  Homicidal Thoughts:  No  Memory:  Immediate;   Fair Recent;   Fair Remote;   Poor  Judgement:  Impaired  Insight:  Lacking  Psychomotor Activity:  Decreased  Concentration:  Concentration: Poor  Recall:  [employer] of Knowledge:  Fair  Language:  Fair  Akathisia:  No  Handed:  Right  AIMS (if indicated):     Assets:  Housing  ADL's:  Impaired  Cognition:  Impaired,  Mild  Sleep:        Treatment Plan Summary: Plan Patient short-term memory is grossly intact and she is able to answer questions although she replies to a lot of my questions that she does not know.  Does not seem particularly bothered by this.  Not clear how much she may be concealing.  Patient clearly is abusing substances by her positive drug screen which she denies.  At this point however there does not seem to be any evidence that there was any intentional suicidality involved.  I counseled the patient that what ever she is doing with pills and drug she is putting herself at risk and that she could have died from this current misadventure and that she should think seriously about making changes to that.  No need however for continued hospitalization from a psychiatric standpoint no need for IVC no need for inpatient treatment.  Discontinued sitter  Disposition: No evidence of imminent risk to self or others at  present.   Patient does not meet criteria for psychiatric inpatient admission. Supportive therapy provided about ongoing stressors. Discussed crisis plan, support from social network, calling 911, coming to the Emergency Department, and calling Suicide Hotline.  Alethia Berthold, MD 01/26/2018 12:56 PM

## 2018-01-29 ENCOUNTER — Telehealth: Payer: Self-pay

## 2018-01-29 NOTE — Telephone Encounter (Signed)
EMMI Follow-up: Noted on the report that the patient hadn't read discharge papers yet and noted there was no follow-up appointment.  I left my contact information for Ms. Claflin to call me be back at her convenience.  I will follow-up again.

## 2018-02-25 ENCOUNTER — Other Ambulatory Visit (INDEPENDENT_AMBULATORY_CARE_PROVIDER_SITE_OTHER): Payer: Medicaid Other

## 2018-02-25 ENCOUNTER — Other Ambulatory Visit (INDEPENDENT_AMBULATORY_CARE_PROVIDER_SITE_OTHER): Payer: Self-pay | Admitting: Nurse Practitioner

## 2018-02-25 ENCOUNTER — Ambulatory Visit (INDEPENDENT_AMBULATORY_CARE_PROVIDER_SITE_OTHER): Payer: Medicaid Other | Admitting: Nurse Practitioner

## 2018-02-25 ENCOUNTER — Encounter (INDEPENDENT_AMBULATORY_CARE_PROVIDER_SITE_OTHER): Payer: Self-pay | Admitting: Vascular Surgery

## 2018-02-25 VITALS — BP 112/66 | HR 78 | Resp 13 | Ht 64.5 in | Wt 149.0 lb

## 2018-02-25 DIAGNOSIS — I82629 Acute embolism and thrombosis of deep veins of unspecified upper extremity: Secondary | ICD-10-CM | POA: Insufficient documentation

## 2018-02-25 DIAGNOSIS — Z789 Other specified health status: Secondary | ICD-10-CM | POA: Diagnosis not present

## 2018-02-25 DIAGNOSIS — F1410 Cocaine abuse, uncomplicated: Secondary | ICD-10-CM

## 2018-02-25 DIAGNOSIS — I82622 Acute embolism and thrombosis of deep veins of left upper extremity: Secondary | ICD-10-CM | POA: Diagnosis not present

## 2018-02-25 DIAGNOSIS — M79602 Pain in left arm: Secondary | ICD-10-CM | POA: Diagnosis not present

## 2018-02-25 DIAGNOSIS — M79603 Pain in arm, unspecified: Secondary | ICD-10-CM | POA: Insufficient documentation
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
## 2018-05-12 ENCOUNTER — Ambulatory Visit (INDEPENDENT_AMBULATORY_CARE_PROVIDER_SITE_OTHER): Payer: Medicaid Other | Admitting: Vascular Surgery

## 2018-05-12 ENCOUNTER — Encounter (INDEPENDENT_AMBULATORY_CARE_PROVIDER_SITE_OTHER): Payer: Self-pay | Admitting: Vascular Surgery

## 2018-05-12 ENCOUNTER — Ambulatory Visit (INDEPENDENT_AMBULATORY_CARE_PROVIDER_SITE_OTHER): Payer: Medicaid Other

## 2018-05-12 VITALS — BP 117/73 | HR 80 | Resp 16 | Ht 64.5 in | Wt 156.0 lb

## 2018-05-12 DIAGNOSIS — I82622 Acute embolism and thrombosis of deep veins of left upper extremity: Secondary | ICD-10-CM | POA: Diagnosis not present

## 2018-05-12 DIAGNOSIS — K219 Gastro-esophageal reflux disease without esophagitis: Secondary | ICD-10-CM | POA: Diagnosis not present

## 2018-05-12 DIAGNOSIS — M79602 Pain in left arm: Secondary | ICD-10-CM | POA: Diagnosis not present

## 2018-05-12 NOTE — Progress Notes (Signed)
MRN : [id number]  Melanie Reid is a 52 y.o. (1966-06-11) female who presents with chief complaint of  Chief Complaint  Patient presents with  . Follow-up    2-3 month LUE venous DVT f/u  .  History of Present Illness:    The patient presents to the office for evaluation of DVT.  DVT was identified at [facility] by Duplex ultrasound.  The initial symptoms were pain and swelling in the left upper extremity.  The patient notes the left arm continues to be very painful but in the wrist and fingers.  No changes in the symptoms with  dependency and her arm does not swell much.  Symptoms are much better with elevation.     The patient has not been using compression therapy at this point.  No SOB or pleuritic chest pains.  No cough or hemoptysis.  No blood per rectum or blood in any sputum.  No excessive bruising per the patient.  Duplex ultrasound of the left arm shows complete resolution of the previously noted DVT.   No outpatient medications have been marked as taking for the 05/12/18 encounter (Office Visit) with Gilda Crease, Latina Craver, MD.    Past Medical History:  Diagnosis Date  . Diverticulitis   . Kidney stones     Past Surgical History:  Procedure Laterality Date  . KNEE ARTHROSCOPY    . TUBAL LIGATION      Social History Social History   Tobacco Use  . Smoking status: Never Smoker  . Smokeless tobacco: Never Used  Substance Use Topics  . Alcohol use: Yes  . Drug use: Not on file    Family History History reviewed. No pertinent family history.  No Known Allergies   REVIEW OF SYSTEMS (Negative unless checked)  Constitutional: [] Weight loss  [] Fever  [] Chills Cardiac: [] Chest pain   [] Chest pressure   [] Palpitations   [] Shortness of breath when laying flat   [] Shortness of breath with exertion. Vascular:  [] Pain in legs with walking   [] Pain in legs at rest  [x] History of DVT   [] Phlebitis   [] Swelling in legs   [] Varicose veins   [] Non-healing  ulcers Pulmonary:   [] Uses home oxygen   [] Productive cough   [] Hemoptysis   [] Wheeze  [] COPD   [] Asthma Neurologic:  [] Dizziness   [] Seizures   [] History of stroke   [] History of TIA  [] Aphasia   [] Vissual changes   [] Weakness or numbness in arm   [] Weakness or numbness in leg Musculoskeletal:   [] Joint swelling   [] Joint pain   [] Low back pain Hematologic:  [] Easy bruising  [] Easy bleeding   [] Hypercoagulable state   [] Anemic Gastrointestinal:  [] Diarrhea   [] Vomiting  [] Gastroesophageal reflux/heartburn   [] Difficulty swallowing. Genitourinary:  [] Chronic kidney disease   [] Difficult urination  [] Frequent urination   [] Blood in urine Skin:  [] Rashes   [] Ulcers  Psychological:  [] History of anxiety   []  History of major depression.  Physical Examination  Vitals:   05/12/18 1349  BP: 117/73  Pulse: 80  Resp: 16  Weight: 156 lb (70.8 kg)  Height: 5' 4.5" (1.638 m)   Body mass index is 26.36 kg/m. Gen: WD/WN, NAD Head: [place]/AT, No temporalis wasting.  Ear/Nose/Throat: Hearing grossly intact, nares w/o erythema or drainage Eyes: PER, EOMI, sclera nonicteric.  Neck: Supple, no large masses.   Pulmonary:  Good air movement, no audible wheezing bilaterally, no use of accessory muscles.  Cardiac: RRR, no JVD Vascular:  Vessel Right Left  Radial Palpable Palpable  Brachial Palpable Palpable  Gastrointestinal: Non-distended. No guarding/no peritoneal signs.  Musculoskeletal: M/S 5/5 throughout.  No deformity or atrophy.  Neurologic: CN 2-12 intact. Symmetrical.  Speech is fluent. Motor exam as listed above. Psychiatric: Judgment intact, Mood & affect appropriate for pt's clinical situation. Dermatologic: No rashes or ulcers noted.  No changes consistent with cellulitis. Lymph : No lichenification or skin changes of chronic lymphedema.  CBC Lab Results  Component Value Date   WBC 5.3 01/26/2018   HGB 12.1 01/26/2018   HCT 34.2 (L) 01/26/2018   MCV 96.2 01/26/2018   PLT 134 (L)  01/26/2018    BMET    Component Value Date/Time   NA 137 01/26/2018 0452   K 3.4 (L) 01/26/2018 0452   CL 102 01/26/2018 0452   CO2 27 01/26/2018 0452   GLUCOSE 105 (H) 01/26/2018 0452   BUN 18 01/26/2018 0452   CREATININE 1.39 (H) 01/26/2018 0452   CALCIUM 8.3 (L) 01/26/2018 0452   GFRNONAA 43 (L) 01/26/2018 0452   GFRAA 50 (L) 01/26/2018 0452   CrCl cannot be calculated (Patient's most recent lab result is older than the maximum 21 days allowed.).  COAG No results found for: INR, PROTIME  Radiology No results found.   Assessment/Plan 1. Deep vein thrombosis (DVT) of brachial vein of left upper extremity, unspecified chronicity (HCC) Recommend:   No surgery or intervention at this point in time.  IVC filter is not indicated at present.  Patient's duplex ultrasound of the venous system shows DVT has resolved.  The patient can discontinue her anticoagulation   Elevation was encouraged.  2. Pain of left upper extremity She will follow up with Ortho  3. Gastroesophageal reflux disease without esophagitis Continue PPI as already ordered, this medication has been reviewed and there are no changes at this time.  Avoidence of caffeine and alcohol  Moderate elevation of the head of the bed     Levora DredgeGregory Monserath Neff, MD  05/12/2018 2:20 PM

## 2018-06-25 HISTORY — PX: CARPAL TUNNEL RELEASE: SHX101

## 2018-07-11 ENCOUNTER — Other Ambulatory Visit: Payer: Self-pay | Admitting: Neurological Surgery

## 2018-07-25 ENCOUNTER — Encounter (HOSPITAL_COMMUNITY): Payer: Self-pay

## 2018-07-25 ENCOUNTER — Other Ambulatory Visit: Payer: Self-pay

## 2018-07-25 ENCOUNTER — Encounter (HOSPITAL_COMMUNITY)
Admission: RE | Admit: 2018-07-25 | Discharge: 2018-07-25 | Disposition: A | Discharge status: Home / Self Care | Payer: Medicaid Other | Source: Ambulatory Visit | Attending: Neurological Surgery | Admitting: Neurological Surgery

## 2018-07-25 DIAGNOSIS — Z01812 Encounter for preprocedural laboratory examination: Secondary | ICD-10-CM | POA: Diagnosis not present

## 2018-07-25 HISTORY — DX: Nausea with vomiting, unspecified: R11.2

## 2018-07-25 HISTORY — DX: Personal history of urinary calculi: Z87.442

## 2018-07-25 HISTORY — DX: Adverse effect of unspecified anesthetic, initial encounter: T41.45XA

## 2018-07-25 HISTORY — DX: Other specified postprocedural states: Z98.890

## 2018-07-25 HISTORY — DX: Ventral hernia without obstruction or gangrene: K43.9

## 2018-07-25 HISTORY — DX: Gastro-esophageal reflux disease without esophagitis: K21.9

## 2018-07-25 LAB — BASIC METABOLIC PANEL
Anion gap: 12 (ref 5–15)
BUN: 19 mg/dL (ref 6–20)
CALCIUM: 9.9 mg/dL (ref 8.9–10.3)
CO2: 22 mmol/L (ref 22–32)
Chloride: 101 mmol/L (ref 98–111)
Creatinine, Ser: 1.48 mg/dL — ABNORMAL HIGH (ref 0.44–1.00)
GFR calc Af Amer: 47 mL/min — ABNORMAL LOW (ref >60)
GFR calc non Af Amer: 41 mL/min — ABNORMAL LOW (ref >60)
Glucose, Bld: 86 mg/dL (ref 70–99)
Potassium: 4.2 mmol/L (ref 3.5–5.1)
Sodium: 135 mmol/L (ref 135–145)

## 2018-07-25 LAB — CBC
HCT: 32.4 % — ABNORMAL LOW (ref 36.0–46.0)
Hemoglobin: 10.7 g/dL — ABNORMAL LOW (ref 12.0–15.0)
MCH: 31.6 pg (ref 26.0–34.0)
MCHC: 33 g/dL (ref 30.0–36.0)
MCV: 95.6 fL (ref 80.0–100.0)
PLATELETS: 220 10*3/uL (ref 150–400)
RBC: 3.39 MIL/uL — ABNORMAL LOW (ref 3.87–5.11)
RDW: 12.6 % (ref 11.5–15.5)
WBC: 4.2 10*3/uL (ref 4.0–10.5)
nRBC: 0 % (ref 0.0–0.2)

## 2018-07-25 LAB — SURGICAL PCR SCREEN
MRSA, PCR: NEGATIVE
Staphylococcus aureus: POSITIVE — AB

## 2018-07-25 NOTE — Progress Notes (Signed)
Surgical PCR positive for Staph.  Called and informed patient. Called mupirocin ointment prescription into [facility] on [address] per patient request.

## 2018-07-25 NOTE — Progress Notes (Addendum)
PCP - Gillermo Murdoch, MD Cardiologist - pt denies  Chest x-ray - 1-view 01/23/18 in EPIC EKG - 01/25/2018 in EPIC  Stress Test - 04/2012 -results in Care Everywhere ECHO - 04/2012-results in Care Everywhere  Cardiac Cath - pt denies  Sleep Study - NO CPAP - n/a  Fasting Blood Sugar - n/a Checks Blood Sugar _____ times a day -n/a  Blood Thinner Instructions: n/a Aspirin Instructions: n/a  Anesthesia review:  Yes-EKG, heart hx  Patient denies shortness of breath, fever, cough and chest pain at PAT appointment  Patient verbalized understanding of instructions that were given to them at the PAT appointment. Patient was also instructed that they will need to review over the PAT instructions again at home before surgery.

## 2018-07-25 NOTE — Pre-Procedure Instructions (Signed)
Melanie Reid  07/25/2018      [facility] - [place], [place] - [address] [place] [address] Phone: [phone] Fax: [phone]  [id number] [place], [place] - [address] AT [facility] [address] [place] [address] Phone: [phone] Fax: [phone]    Your procedure is scheduled on August 04, 2018.  Report to [facility] Admitting at 530 AM.  Call this number if you have problems the morning of surgery:  [phone]   Remember:  Do not eat or drink after midnight.    Take these medicines the morning of surgery with A SIP OF WATER  DULoxetine (CYMBALTA)  gabapentin (NEURONTIN)  pantoprazole (PROTONIX)    7 days prior to surgery STOP taking any Aspirin (unless otherwise instructed by your surgeon), Aleve, Naproxen, Ibuprofen, Motrin, Advil, Goody's, BC's, all herbal medications, fish oil, and all vitamins    Do not wear jewelry, make-up or nail polish.  Do not wear lotions, powders, or perfumes, or deodorant.  Do not shave 48 hours prior to surgery.    Do not bring valuables to the hospital.  [facility] is not responsible for any belongings or valuables.  Contacts, dentures or bridgework may not be worn into surgery.  Leave your suitcase in the car.  After surgery it may be brought to your room.  For patients admitted to the hospital, discharge time will be determined by your treatment team.  Patients discharged the day of surgery will not be allowed to drive home.    [facility]- Preparing For Surgery  Before surgery, you can play an important role. Because skin is not sterile, your skin needs to be as free of germs as possible. You can reduce the number of germs on your skin by washing with CHG (chlorahexidine gluconate) Soap before surgery.  CHG is an antiseptic cleaner which kills germs and bonds with the skin to continue killing germs even after washing.     Oral Hygiene is also important to reduce your risk of infection.  Remember - BRUSH YOUR TEETH THE MORNING OF SURGERY WITH YOUR REGULAR TOOTHPASTE  Please do not use if you have an allergy to CHG or antibacterial soaps. If your skin becomes reddened/irritated stop using the CHG.  Do not shave (including legs and underarms) for at least 48 hours prior to first CHG shower. It is OK to shave your face.  Please follow these instructions carefully.   1. Shower the NIGHT BEFORE SURGERY and the MORNING OF SURGERY with CHG.   2. If you chose to wash your hair, wash your hair first as usual with your normal shampoo.  3. After you shampoo, rinse your hair and body thoroughly to remove the shampoo.  4. Use CHG as you would any other liquid soap. You can apply CHG directly to the skin and wash gently with a scrungie or a clean washcloth.   5. Apply the CHG Soap to your body ONLY FROM THE NECK DOWN.  Do not use on open wounds or open sores. Avoid contact with your eyes, ears, mouth and genitals (private parts). Wash Face and genitals (private parts)  with your normal soap.  6. Wash thoroughly, paying special attention to the area where your surgery will be performed.  7. Thoroughly rinse your body with warm water from the neck down.  8. DO NOT shower/wash with your normal soap after  using and rinsing off the CHG Soap.  9. Pat yourself dry with a CLEAN TOWEL.  10. Wear CLEAN PAJAMAS to bed the night before surgery, wear comfortable clothes the morning of surgery  11. Place CLEAN SHEETS on your bed the night of your first shower and DO NOT SLEEP WITH PETS.    Day of Surgery:  Do not apply any deodorants/lotions.  Please wear clean clothes to the hospital/surgery center.   Remember to brush your teeth WITH YOUR REGULAR TOOTHPASTE.   Please read over the following fact sheets that you were given.

## 2018-07-28 NOTE — Progress Notes (Addendum)
Anesthesia Chart Review:  Case:  [id number] Date/Time:  08/04/18 0715   Procedure:  Right Lumbar 5 Sacral 1 Microdiscectomy with Met-RX (Right Back) - Right Lumbar 5 Sacral 1 Microdiscectomy with Met-RX   Anesthesia type:  General   Pre-op diagnosis:  Herniated nucleus pulposus, Lumbar   Location:  [facility] OR ROOM 21 / [facility]   Surgeon:  Kristeen Miss, MD      DISCUSSION: 53 yo female never smoker. Pertinent hx includes PONV, GERD, CKD.  Pt recently admitted 8/1-01/26/18 for drug overdose, acute renal failure, AMS, acute hypoxic respiratory failure. Acute hypoxic respiratory failure requiring brief intubation due to altered mental status: Patient was extubated on 01/24/18. Acute encephalopathy possibly from empty bottles of amitriptyline, Zanaflex and Lexapro. Was evaluated by psychiatry while in the hospital.  Encephalopathy improved and she was at baseline prior to discharge. She denied using cocaine however her urine toxicology was positive for cocaine.  Acute kidney injury and rhabdomyolysis improved with IV fluids. She had a LUE DVT treated with Eliquis. F/u ultrasound 05/12/2018 of the left arm showed complete resolution of the previously noted DVT.  While admitted pt had abnormal EKG in the setting of encephalopathy due to drug overdose showing incomplete LBBB. Comparison EKG from PCPs office 12/16/2017 shows sinus rhythm with occas. PVCs. Rate 79.   She saw a cardiologist in 2015 for HTN and chest tightness. She had a normal stress echo with EF 55-60%. Symptoms improved after starting prilosec for GERD.  Anticipate she can proceed as planned barring acute status change.   VS: BP (!) 141/80   Pulse 75   Temp 36.6 C (Oral)   Resp 18   Ht 5' 4" (1.626 m)   Wt 74.5 kg   LMP 07/22/2018   SpO2 100%   BMI 28.20 kg/m   PROVIDERS: Jodi Marble, MD is PCP   LABS: Labs reviewed: Acceptable for surgery. Mildly elevated creatinine, review of previous labs shows baseline ~1.5. (all labs ordered  are listed, but only abnormal results are displayed)  Labs Reviewed  SURGICAL PCR SCREEN - Abnormal; Notable for the following components:      Result Value   Staphylococcus aureus POSITIVE (*)    All other components within normal limits  BASIC METABOLIC PANEL - Abnormal; Notable for the following components:   Creatinine, Ser 1.48 (*)    GFR calc non Af Amer 41 (*)    GFR calc Af Amer 47 (*)    All other components within normal limits  CBC - Abnormal; Notable for the following components:   RBC 3.39 (*)    Hemoglobin 10.7 (*)    HCT 32.4 (*)    All other components within normal limits     IMAGES: PORTABLE CHEST 1 VIEW 01/23/2018  COMPARISON:  01/23/2018  FINDINGS: Interval intubation, tip of the endotracheal tube is about 4.1 cm superior to the carina. Right lung is clear. Mild cardiomegaly. Streaky atelectasis at the left base. No pneumothorax.  IMPRESSION: 1. Endotracheal tube tip about 4.1 cm superior to carina 2. Borderline cardiomegaly. Streaky atelectasis at the left lung base.  EKG: 01/25/2018: Normal sinus rhythm. Rate 95. Right axis deviation. Incomplete left bundle branch block  12/16/2017 (copy on pt chart): Sinus rhythm with occas. PVCs. Rate 79.   CV: Stress echo 05/20/2012 (care everywhere): ECG Analysis  Resting HOZ:YYQMGN sinus rhythm  Stress ECG: No abnormal ST/T wave changes with exercise  Arrhythmia: Occasional PVCs  REST ECHO FINDINGS  Left Ventricle Normal global left  ventricular size, systolic function with no obvious regional wall motion abnormalities.  Mild left ventricular hypertrophy. Normal left ventricular ejection fraction estimated at 55-60%.  Right VentricleNormal right ventricular size and function.  Right Atrium Normal right atrial size.  Left AtriumNormal left atrial size.  Mitral Valve Normal function and mobility of the mitral valve leaflets.  Aortic Valve  Structurally normal trileaflet aortic valve.  Tricuspid ValveStructurally normal tricuspid valve. Trace tricuspid regurgitation.  Pulmonic Valve Structurally normal pulmonic valve.  PericardiumNo pericardial or pleural effusion.  AortaNormal size aortic root and proximal ascending aorta.  STRESS ECHO FINDINGS  The global left ventricular systolic function improved with stress. No wall motion changes with stress.   Past Medical History:  Diagnosis Date  . Complication of anesthesia   . Diverticulitis   . GERD (gastroesophageal reflux disease)   . History of kidney stones   . PONV (postoperative nausea and vomiting)   . Ventral hernia     Past Surgical History:  Procedure Laterality Date  . CARPAL TUNNEL RELEASE Right   . COLONOSCOPY    . ESOPHAGOGASTRODUODENOSCOPY ENDOSCOPY    . KNEE ARTHROSCOPY    . TUBAL LIGATION      MEDICATIONS: . apixaban (ELIQUIS) 5 MG TABS tablet  . atorvastatin (LIPITOR) 10 MG tablet  . chlorthalidone (HYGROTON) 25 MG tablet  . DULoxetine (CYMBALTA) 60 MG capsule  . gabapentin (NEURONTIN) 300 MG capsule  . lisinopril (PRINIVIL,ZESTRIL) 40 MG tablet  . pantoprazole (PROTONIX) 40 MG tablet  . tiZANidine (ZANAFLEX) 2 MG tablet   No current facility-administered medications for this encounter.     James Burns, PA-C [facility]/Anesthesiology Phone [phone] 07/29/2018 9:06 AM      

## 2018-07-29 NOTE — Anesthesia Preprocedure Evaluation (Addendum)
Anesthesia Evaluation  Patient identified by MRN, date of birth, ID band Patient awake    Reviewed: Allergy & Precautions, NPO status , Patient's Chart, lab work & pertinent test results  Airway Mallampati: II  TM Distance: >3 FB Neck ROM: Full    Dental  (+) Teeth Intact, Dental Advisory Given   Pulmonary    breath sounds clear to auscultation       Cardiovascular  Rhythm:Regular Rate:Normal     Neuro/Psych    GI/Hepatic   Endo/Other    Renal/GU      Musculoskeletal   Abdominal   Peds  Hematology   Anesthesia Other Findings   Reproductive/Obstetrics                             Anesthesia Physical Anesthesia Plan  ASA: II  Anesthesia Plan: General   Post-op Pain Management:    Induction: Intravenous  PONV Risk Score and Plan: 1 and Ondansetron, Dexamethasone and Scopolamine patch - Pre-op  Airway Management Planned: Oral ETT  Additional Equipment:   Intra-op Plan:   Post-operative Plan: Extubation in OR  Informed Consent: I have reviewed the patients History and Physical, chart, labs and discussed the procedure including the risks, benefits and alternatives for the proposed anesthesia with the patient or authorized representative who has indicated his/her understanding and acceptance.     Dental advisory given  Plan Discussed with: CRNA and Anesthesiologist  Anesthesia Plan Comments: (See PAT note by Antionette Poles, PA-C )       Anesthesia Quick Evaluation

## 2018-08-04 ENCOUNTER — Ambulatory Visit (HOSPITAL_COMMUNITY): Payer: Medicaid Other

## 2018-08-04 ENCOUNTER — Encounter (HOSPITAL_COMMUNITY)
Admission: RE | Disposition: A | Discharge status: Home / Self Care | Payer: Self-pay | Source: Home / Self Care | Attending: Neurological Surgery

## 2018-08-04 ENCOUNTER — Ambulatory Visit (HOSPITAL_COMMUNITY): Payer: Medicaid Other | Admitting: Anesthesiology

## 2018-08-04 ENCOUNTER — Ambulatory Visit (HOSPITAL_COMMUNITY)
Admission: RE | Admit: 2018-08-04 | Discharge: 2018-08-04 | Disposition: A | Discharge status: Home / Self Care | Payer: Medicaid Other | Attending: Neurological Surgery | Admitting: Neurological Surgery

## 2018-08-04 ENCOUNTER — Other Ambulatory Visit: Payer: Self-pay

## 2018-08-04 ENCOUNTER — Ambulatory Visit (HOSPITAL_COMMUNITY): Payer: Medicaid Other | Admitting: Physician Assistant

## 2018-08-04 ENCOUNTER — Encounter (HOSPITAL_COMMUNITY): Payer: Self-pay | Admitting: *Deleted

## 2018-08-04 DIAGNOSIS — Z888 Allergy status to other drugs, medicaments and biological substances status: Secondary | ICD-10-CM | POA: Insufficient documentation

## 2018-08-04 DIAGNOSIS — Z419 Encounter for procedure for purposes other than remedying health state, unspecified: Secondary | ICD-10-CM

## 2018-08-04 DIAGNOSIS — Z79899 Other long term (current) drug therapy: Secondary | ICD-10-CM | POA: Insufficient documentation

## 2018-08-04 DIAGNOSIS — K219 Gastro-esophageal reflux disease without esophagitis: Secondary | ICD-10-CM | POA: Diagnosis not present

## 2018-08-04 DIAGNOSIS — M5116 Intervertebral disc disorders with radiculopathy, lumbar region: Secondary | ICD-10-CM | POA: Diagnosis present

## 2018-08-04 DIAGNOSIS — M5127 Other intervertebral disc displacement, lumbosacral region: Secondary | ICD-10-CM | POA: Diagnosis present

## 2018-08-04 DIAGNOSIS — Z87442 Personal history of urinary calculi: Secondary | ICD-10-CM | POA: Insufficient documentation

## 2018-08-04 DIAGNOSIS — M5117 Intervertebral disc disorders with radiculopathy, lumbosacral region: Secondary | ICD-10-CM | POA: Insufficient documentation

## 2018-08-04 DIAGNOSIS — Z7901 Long term (current) use of anticoagulants: Secondary | ICD-10-CM | POA: Insufficient documentation

## 2018-08-04 HISTORY — PX: LUMBAR LAMINECTOMY/ DECOMPRESSION WITH MET-RX: SHX5959

## 2018-08-04 LAB — POCT PREGNANCY, URINE: Preg Test, Ur: NEGATIVE

## 2018-08-04 SURGERY — LUMBAR LAMINECTOMY/ DECOMPRESSION WITH MET-RX
Anesthesia: General | Site: Back | Laterality: Right

## 2018-08-04 MED ORDER — CHLORHEXIDINE GLUCONATE CLOTH 2 % EX PADS: 6.0000 | MEDICATED_PAD | Freq: Once | CUTANEOUS | Status: DC

## 2018-08-04 MED ORDER — 0.9 % SODIUM CHLORIDE (POUR BTL) OPTIME
TOPICAL | Status: DC | PRN
  Administered 2018-08-04: 1000 mL

## 2018-08-04 MED ORDER — ACETAMINOPHEN 650 MG RE SUPP: 650.0000 mg | RECTAL | Status: DC | PRN

## 2018-08-04 MED ORDER — ROCURONIUM BROMIDE 50 MG/5ML IV SOSY
PREFILLED_SYRINGE | INTRAVENOUS | Status: AC
  Filled 2018-08-04: qty 5

## 2018-08-04 MED ORDER — SODIUM CHLORIDE 0.9 % IV SOLN: 250.0000 mL | INTRAVENOUS | Status: DC

## 2018-08-04 MED ORDER — DIAZEPAM 5 MG PO TABS
5.0000 mg | ORAL_TABLET | Freq: Four times a day (QID) | ORAL | Status: DC | PRN
  Administered 2018-08-04: 5 mg via ORAL

## 2018-08-04 MED ORDER — LISINOPRIL 20 MG PO TABS
40.0000 mg | ORAL_TABLET | Freq: Every day | ORAL | Status: DC
  Administered 2018-08-04: 40 mg via ORAL
  Filled 2018-08-04: qty 2

## 2018-08-04 MED ORDER — DEXAMETHASONE SODIUM PHOSPHATE 10 MG/ML IJ SOLN
INTRAMUSCULAR | Status: AC
  Filled 2018-08-04: qty 1

## 2018-08-04 MED ORDER — SODIUM CHLORIDE 0.9 % IV SOLN
INTRAVENOUS | Status: DC | PRN
  Administered 2018-08-04: 07:00:00

## 2018-08-04 MED ORDER — DULOXETINE HCL 30 MG PO CPEP: 60.0000 mg | ORAL_CAPSULE | Freq: Every day | ORAL | Status: DC

## 2018-08-04 MED ORDER — BUPIVACAINE HCL (PF) 0.5 % IJ SOLN
INTRAMUSCULAR | Status: DC | PRN
  Administered 2018-08-04: 10 mL

## 2018-08-04 MED ORDER — DIAZEPAM 5 MG PO TABS
ORAL_TABLET | ORAL | Status: AC
  Filled 2018-08-04: qty 1

## 2018-08-04 MED ORDER — SUGAMMADEX SODIUM 200 MG/2ML IV SOLN
INTRAVENOUS | Status: DC | PRN
  Administered 2018-08-04: 200 mg via INTRAVENOUS

## 2018-08-04 MED ORDER — MENTHOL 3 MG MT LOZG: 1.0000 | LOZENGE | OROMUCOSAL | Status: DC | PRN

## 2018-08-04 MED ORDER — KETOROLAC TROMETHAMINE 15 MG/ML IJ SOLN
15.0000 mg | Freq: Four times a day (QID) | INTRAMUSCULAR | Status: DC
  Administered 2018-08-04: 15 mg via INTRAVENOUS
  Filled 2018-08-04: qty 1

## 2018-08-04 MED ORDER — MIDAZOLAM HCL 2 MG/2ML IJ SOLN
INTRAMUSCULAR | Status: AC
  Filled 2018-08-04: qty 2

## 2018-08-04 MED ORDER — SODIUM CHLORIDE 0.9% FLUSH: 3.0000 mL | Freq: Two times a day (BID) | INTRAVENOUS | Status: DC

## 2018-08-04 MED ORDER — DEXAMETHASONE SODIUM PHOSPHATE 10 MG/ML IJ SOLN
INTRAMUSCULAR | Status: DC | PRN
  Administered 2018-08-04: 10 mg via INTRAVENOUS

## 2018-08-04 MED ORDER — SCOPOLAMINE 1 MG/3DAYS TD PT72
MEDICATED_PATCH | TRANSDERMAL | Status: DC | PRN
  Administered 2018-08-04: 1 via TRANSDERMAL

## 2018-08-04 MED ORDER — OXYCODONE-ACETAMINOPHEN 5-325 MG PO TABS: 1.0000 | ORAL_TABLET | ORAL | 0 refills | Status: DC | PRN

## 2018-08-04 MED ORDER — PROPOFOL 10 MG/ML IV BOLUS
INTRAVENOUS | Status: DC | PRN
  Administered 2018-08-04: 30 mg via INTRAVENOUS
  Administered 2018-08-04: 40 mg via INTRAVENOUS
  Administered 2018-08-04: 130 mg via INTRAVENOUS

## 2018-08-04 MED ORDER — LACTATED RINGERS IV SOLN
INTRAVENOUS | Status: DC | PRN
  Administered 2018-08-04: 07:00:00 via INTRAVENOUS

## 2018-08-04 MED ORDER — SODIUM CHLORIDE 0.9% FLUSH: 3.0000 mL | INTRAVENOUS | Status: DC | PRN

## 2018-08-04 MED ORDER — ACETAMINOPHEN 325 MG PO TABS: 650.0000 mg | ORAL_TABLET | ORAL | Status: DC | PRN

## 2018-08-04 MED ORDER — FLEET ENEMA 7-19 GM/118ML RE ENEM: 1.0000 | ENEMA | Freq: Once | RECTAL | Status: DC | PRN

## 2018-08-04 MED ORDER — FENTANYL CITRATE (PF) 100 MCG/2ML IJ SOLN
INTRAMUSCULAR | Status: AC
  Filled 2018-08-04: qty 2

## 2018-08-04 MED ORDER — CEFAZOLIN SODIUM-DEXTROSE 2-4 GM/100ML-% IV SOLN
2.0000 g | INTRAVENOUS | Status: AC
  Administered 2018-08-04: 2 g via INTRAVENOUS
  Filled 2018-08-04: qty 100

## 2018-08-04 MED ORDER — BUPIVACAINE HCL (PF) 0.5 % IJ SOLN
INTRAMUSCULAR | Status: AC
  Filled 2018-08-04: qty 30

## 2018-08-04 MED ORDER — OXYCODONE HCL 5 MG/5ML PO SOLN: 5.0000 mg | Freq: Once | ORAL | Status: AC | PRN

## 2018-08-04 MED ORDER — ONDANSETRON HCL 4 MG/2ML IJ SOLN
INTRAMUSCULAR | Status: DC | PRN
  Administered 2018-08-04: 4 mg via INTRAVENOUS

## 2018-08-04 MED ORDER — PANTOPRAZOLE SODIUM 40 MG PO TBEC: 40.0000 mg | DELAYED_RELEASE_TABLET | Freq: Every day | ORAL | Status: DC

## 2018-08-04 MED ORDER — GABAPENTIN 300 MG PO CAPS: 300.0000 mg | ORAL_CAPSULE | Freq: Three times a day (TID) | ORAL | Status: DC

## 2018-08-04 MED ORDER — DOCUSATE SODIUM 100 MG PO CAPS
100.0000 mg | ORAL_CAPSULE | Freq: Two times a day (BID) | ORAL | Status: DC
  Administered 2018-08-04: 100 mg via ORAL
  Filled 2018-08-04: qty 1

## 2018-08-04 MED ORDER — ONDANSETRON HCL 4 MG/2ML IJ SOLN: 4.0000 mg | Freq: Once | INTRAMUSCULAR | Status: DC | PRN

## 2018-08-04 MED ORDER — SCOPOLAMINE 1 MG/3DAYS TD PT72
MEDICATED_PATCH | TRANSDERMAL | Status: AC
  Filled 2018-08-04: qty 1

## 2018-08-04 MED ORDER — SENNA 8.6 MG PO TABS
1.0000 | ORAL_TABLET | Freq: Two times a day (BID) | ORAL | Status: DC
  Administered 2018-08-04: 8.6 mg via ORAL
  Filled 2018-08-04: qty 1

## 2018-08-04 MED ORDER — DEXMEDETOMIDINE HCL 200 MCG/2ML IV SOLN
INTRAVENOUS | Status: DC | PRN
  Administered 2018-08-04: 4 ug via INTRAVENOUS
  Administered 2018-08-04 (×2): 8 ug via INTRAVENOUS

## 2018-08-04 MED ORDER — FENTANYL CITRATE (PF) 100 MCG/2ML IJ SOLN
INTRAMUSCULAR | Status: DC | PRN
  Administered 2018-08-04: 50 ug via INTRAVENOUS

## 2018-08-04 MED ORDER — LIDOCAINE-EPINEPHRINE 1 %-1:100000 IJ SOLN
INTRAMUSCULAR | Status: AC
  Filled 2018-08-04: qty 1

## 2018-08-04 MED ORDER — HEMOSTATIC AGENTS (NO CHARGE) OPTIME
TOPICAL | Status: DC | PRN
  Administered 2018-08-04: 1 via TOPICAL

## 2018-08-04 MED ORDER — SODIUM CHLORIDE 0.9 % IV SOLN
INTRAVENOUS | Status: DC | PRN
  Administered 2018-08-04: 20 ug/min via INTRAVENOUS

## 2018-08-04 MED ORDER — ONDANSETRON HCL 4 MG PO TABS: 4.0000 mg | ORAL_TABLET | Freq: Four times a day (QID) | ORAL | Status: DC | PRN

## 2018-08-04 MED ORDER — CHLORTHALIDONE 25 MG PO TABS
25.0000 mg | ORAL_TABLET | Freq: Every day | ORAL | Status: DC
  Administered 2018-08-04: 25 mg via ORAL
  Filled 2018-08-04: qty 1

## 2018-08-04 MED ORDER — BISACODYL 10 MG RE SUPP: 10.0000 mg | Freq: Every day | RECTAL | Status: DC | PRN

## 2018-08-04 MED ORDER — ROCURONIUM BROMIDE 10 MG/ML (PF) SYRINGE
PREFILLED_SYRINGE | INTRAVENOUS | Status: DC | PRN
  Administered 2018-08-04: 50 mg via INTRAVENOUS
  Administered 2018-08-04: 20 mg via INTRAVENOUS

## 2018-08-04 MED ORDER — THROMBIN 5000 UNITS EX SOLR
OROMUCOSAL | Status: DC | PRN
  Administered 2018-08-04: 07:00:00 via TOPICAL

## 2018-08-04 MED ORDER — THROMBIN 5000 UNITS EX SOLR
CUTANEOUS | Status: DC | PRN
  Administered 2018-08-04 (×2): 5000 [IU] via TOPICAL

## 2018-08-04 MED ORDER — ONDANSETRON HCL 4 MG/2ML IJ SOLN: 4.0000 mg | Freq: Four times a day (QID) | INTRAMUSCULAR | Status: DC | PRN

## 2018-08-04 MED ORDER — ATORVASTATIN CALCIUM 10 MG PO TABS: 10.0000 mg | ORAL_TABLET | Freq: Every day | ORAL | Status: DC

## 2018-08-04 MED ORDER — OXYCODONE HCL 5 MG PO TABS
ORAL_TABLET | ORAL | Status: AC
  Filled 2018-08-04: qty 1

## 2018-08-04 MED ORDER — DIAZEPAM 5 MG PO TABS: 5.0000 mg | ORAL_TABLET | Freq: Four times a day (QID) | ORAL | 0 refills | Status: DC | PRN

## 2018-08-04 MED ORDER — THROMBIN 5000 UNITS EX SOLR
CUTANEOUS | Status: AC
  Filled 2018-08-04: qty 15000

## 2018-08-04 MED ORDER — MIDAZOLAM HCL 5 MG/5ML IJ SOLN
INTRAMUSCULAR | Status: DC | PRN
  Administered 2018-08-04: 2 mg via INTRAVENOUS

## 2018-08-04 MED ORDER — OXYCODONE-ACETAMINOPHEN 5-325 MG PO TABS
1.0000 | ORAL_TABLET | ORAL | Status: DC | PRN
  Administered 2018-08-04 (×2): 2 via ORAL
  Filled 2018-08-04 (×2): qty 2

## 2018-08-04 MED ORDER — CEFAZOLIN SODIUM-DEXTROSE 2-4 GM/100ML-% IV SOLN
2.0000 g | Freq: Three times a day (TID) | INTRAVENOUS | Status: DC
  Administered 2018-08-04: 2 g via INTRAVENOUS
  Filled 2018-08-04: qty 100

## 2018-08-04 MED ORDER — FENTANYL CITRATE (PF) 250 MCG/5ML IJ SOLN
INTRAMUSCULAR | Status: AC
  Filled 2018-08-04: qty 5

## 2018-08-04 MED ORDER — LIDOCAINE 2% (20 MG/ML) 5 ML SYRINGE
INTRAMUSCULAR | Status: DC | PRN
  Administered 2018-08-04 (×2): 50 mg via INTRAVENOUS

## 2018-08-04 MED ORDER — OXYCODONE HCL 5 MG PO TABS
5.0000 mg | ORAL_TABLET | Freq: Once | ORAL | Status: AC | PRN
  Administered 2018-08-04: 5 mg via ORAL

## 2018-08-04 MED ORDER — LIDOCAINE-EPINEPHRINE 1 %-1:100000 IJ SOLN
INTRAMUSCULAR | Status: DC | PRN
  Administered 2018-08-04: 10 mL

## 2018-08-04 MED ORDER — LIDOCAINE 2% (20 MG/ML) 5 ML SYRINGE
INTRAMUSCULAR | Status: AC
  Filled 2018-08-04: qty 5

## 2018-08-04 MED ORDER — FENTANYL CITRATE (PF) 100 MCG/2ML IJ SOLN
25.0000 ug | INTRAMUSCULAR | Status: DC | PRN
  Administered 2018-08-04 (×2): 25 ug via INTRAVENOUS

## 2018-08-04 MED ORDER — POLYETHYLENE GLYCOL 3350 17 G PO PACK: 17.0000 g | PACK | Freq: Every day | ORAL | Status: DC | PRN

## 2018-08-04 MED ORDER — PROPOFOL 10 MG/ML IV BOLUS
INTRAVENOUS | Status: AC
  Filled 2018-08-04: qty 20

## 2018-08-04 MED ORDER — PHENOL 1.4 % MT LIQD: 1.0000 | OROMUCOSAL | Status: DC | PRN

## 2018-08-04 MED ORDER — ESMOLOL HCL 100 MG/10ML IV SOLN
INTRAVENOUS | Status: AC
  Filled 2018-08-04: qty 10

## 2018-08-04 MED ORDER — TIZANIDINE HCL 4 MG PO TABS: 2.0000 mg | ORAL_TABLET | Freq: Every day | ORAL | Status: DC

## 2018-08-04 MED ORDER — ONDANSETRON HCL 4 MG/2ML IJ SOLN
INTRAMUSCULAR | Status: AC
  Filled 2018-08-04: qty 2

## 2018-08-04 SURGICAL SUPPLY — 54 items
BAG DECANTER FOR FLEXI CONT (MISCELLANEOUS) ×3 IMPLANT
BLADE CLIPPER SURG (BLADE) IMPLANT
BLADE SURG 15 STRL LF DISP TIS (BLADE) ×1 IMPLANT
BLADE SURG 15 STRL SS (BLADE) ×2
BUR 2.5 MTCH HD 16 (BUR) IMPLANT
BUR 2.5MM MTCH HD 16CM (BUR)
BUR MATCHSTICK NEURO 3.0 LAGG (BURR) ×3 IMPLANT
CANISTER SUCT 3000ML PPV (MISCELLANEOUS) ×3 IMPLANT
COVER WAND RF STERILE (DRAPES) ×3 IMPLANT
DECANTER SPIKE VIAL GLASS SM (MISCELLANEOUS) ×3 IMPLANT
DERMABOND ADVANCED (GAUZE/BANDAGES/DRESSINGS) ×2
DERMABOND ADVANCED .7 DNX12 (GAUZE/BANDAGES/DRESSINGS) ×1 IMPLANT
DEVICE DISSECT PLASMABLAD 3.0S (MISCELLANEOUS) ×1 IMPLANT
DRAPE C-ARM 42X72 X-RAY (DRAPES) IMPLANT
DRAPE LAPAROTOMY 100X72X124 (DRAPES) ×3 IMPLANT
DRAPE MICROSCOPE LEICA (MISCELLANEOUS) ×3 IMPLANT
DRAPE POUCH INSTRU U-SHP 10X18 (DRAPES) ×3 IMPLANT
DURAPREP 6ML APPLICATOR 50/CS (WOUND CARE) ×3 IMPLANT
ELECT BLADE 6.5 EXT (BLADE) ×3 IMPLANT
ELECT REM PT RETURN 9FT ADLT (ELECTROSURGICAL) ×3
ELECTRODE REM PT RTRN 9FT ADLT (ELECTROSURGICAL) ×1 IMPLANT
GAUZE 4X4 16PLY RFD (DISPOSABLE) IMPLANT
GLOVE BIO SURGEON STRL SZ7.5 (GLOVE) ×3 IMPLANT
GLOVE BIOGEL PI IND STRL 7.5 (GLOVE) ×1 IMPLANT
GLOVE BIOGEL PI IND STRL 8.5 (GLOVE) ×1 IMPLANT
GLOVE BIOGEL PI INDICATOR 7.5 (GLOVE) ×2
GLOVE BIOGEL PI INDICATOR 8.5 (GLOVE) ×2
GLOVE ECLIPSE 8.5 STRL (GLOVE) ×3 IMPLANT
GLOVE EXAM NITRILE XL STR (GLOVE) IMPLANT
GOWN STRL REUS W/ TWL LRG LVL3 (GOWN DISPOSABLE) ×1 IMPLANT
GOWN STRL REUS W/ TWL XL LVL3 (GOWN DISPOSABLE) ×1 IMPLANT
GOWN STRL REUS W/TWL 2XL LVL3 (GOWN DISPOSABLE) ×3 IMPLANT
GOWN STRL REUS W/TWL LRG LVL3 (GOWN DISPOSABLE) ×2
GOWN STRL REUS W/TWL XL LVL3 (GOWN DISPOSABLE) ×2
KIT BASIN OR (CUSTOM PROCEDURE TRAY) ×3 IMPLANT
KIT TURNOVER KIT B (KITS) ×3 IMPLANT
NEEDLE HYPO 18GX1.5 BLUNT FILL (NEEDLE) IMPLANT
NEEDLE HYPO 22GX1.5 SAFETY (NEEDLE) ×3 IMPLANT
NEEDLE SPNL 20GX3.5 QUINCKE YW (NEEDLE) ×3 IMPLANT
NS IRRIG 1000ML POUR BTL (IV SOLUTION) ×3 IMPLANT
PACK LAMINECTOMY NEURO (CUSTOM PROCEDURE TRAY) ×3 IMPLANT
PAD ARMBOARD 7.5X6 YLW CONV (MISCELLANEOUS) ×9 IMPLANT
PATTIES SURGICAL .5 X1 (DISPOSABLE) ×3 IMPLANT
PLASMABLADE 3.0S (MISCELLANEOUS) ×3
RUBBERBAND STERILE (MISCELLANEOUS) ×6 IMPLANT
SPONGE SURGIFOAM ABS GEL SZ50 (HEMOSTASIS) ×3 IMPLANT
SUT VIC AB 0 CT1 18XCR BRD8 (SUTURE) ×1 IMPLANT
SUT VIC AB 0 CT1 8-18 (SUTURE) ×2
SUT VIC AB 2-0 CP2 18 (SUTURE) ×3 IMPLANT
SUT VIC AB 3-0 SH 8-18 (SUTURE) ×3 IMPLANT
SYR 5ML LL (SYRINGE) IMPLANT
TOWEL GREEN STERILE (TOWEL DISPOSABLE) ×3 IMPLANT
TOWEL GREEN STERILE FF (TOWEL DISPOSABLE) ×3 IMPLANT
WATER STERILE IRR 1000ML POUR (IV SOLUTION) ×3 IMPLANT

## 2018-08-04 NOTE — Discharge Instructions (Signed)
Wound Care °Leave incision open to air. °You may shower. °Do not scrub directly on incision.  °Do not put any creams, lotions, or ointments on incision. °Activity °Walk each and every day, increasing distance each day. °No lifting greater than 5 lbs.   °No driving for 2 weeks; may ride as a passenger locally. °Diet °Resume your normal diet.  °Return to Work °Will be discussed at you follow up appointment. °Call Your Doctor If Any of These Occur °Redness, drainage, or swelling at the wound.  °Temperature greater than 101 degrees. °Severe pain not relieved by pain medication. °Increased difficulty swallowing. °Incision starts to come apart. °Follow Up Appt °Call today for appointment in 2-4 weeks ([phone]) or for problems.  If you have any hardware placed in your spine, you will need an x-ray before your appointment. °

## 2018-08-04 NOTE — Anesthesia Postprocedure Evaluation (Signed)
Anesthesia Post Note  Patient: Melanie Reid  Procedure(s) Performed: Right Lumbar five Sacral one Microdiscectomy (Right Back)     Patient location during evaluation: PACU Anesthesia Type: General Level of consciousness: awake and alert Pain management: pain level controlled Vital Signs Assessment: post-procedure vital signs reviewed and stable Respiratory status: spontaneous breathing, nonlabored ventilation, respiratory function stable and patient connected to nasal cannula oxygen Cardiovascular status: blood pressure returned to baseline and stable Postop Assessment: no apparent nausea or vomiting Anesthetic complications: no    Last Vitals:  Vitals:   08/04/18 1033 08/04/18 1525  BP: 129/73 129/68  Pulse: 69 80  Resp: 18 18  Temp: (!) 36.4 C 37.1 C  SpO2: 99% 100%    Last Pain:  Vitals:   08/04/18 1656  TempSrc:   PainSc: 4                  Dontae Minerva COKER

## 2018-08-04 NOTE — Op Note (Signed)
Date of surgery: 08/04/2018 Preoperative diagnosis: Herniated nucleus pulposus L5-S1 right with foraminal disc protrusion L5-S1. Postoperative diagnosis: Same Procedure: Laminotomy L5-S1 on the right with operating microscope microdissection technique discectomy L5-S1 foraminal zone L5-S1. Surgeon: Barnett Abu First assistant: Ervin Knack, MD Anesthesia: General endotracheal Indications: Injured Melanie Reid is a 53 year old individuals had significant right lumbar radicular pain she has evidence of a foraminal disc protrusion at L5-S1 on the right causing compression on the L5 nerve root.  She was advised regarding the need for surgery having failed significant efforts at conservative management.  Procedure: Patient was brought to the operating room supine on the stretcher.  After the smooth induction of general endotracheal anesthesia she was turned prone.  The back was prepped with alcohol DuraPrep and draped in a sterile fashion.  A midline incision was created over the L5 laminar arch and verification of this was obtained radiographically.  Then in an area just superior to the disc space itself a laminotomy was created in the lamina of L5 and carried out to the yellow ligament and the facet joint.  Using an operating microscope then a micro dissection was undertaken just superior to the disc space and as the tissues were uncovered there was noted to be a fragment of disc with some thinned ligament over it.  Ligament was opened with a 15 blade and several fragments of disc were removed.  Then further exploration yielded the thinned ligament attached to several other fragments of disc out into the foramen.  These were carefully dissected using a curved curette and a 2 mm curved Kerrison punch.  In the end the L5 nerve root was well decompressed.  Hemostasis was achieved in the soft tissues and after removing the retractor the lumbodorsal fascia was closed with #1 Vicryl in interrupted fashion 2-0 Vicryl  was used in subcutaneous tissues and 3-0 Vicryl subcuticularly.  Dermabond was placed on the skin.  Blood loss was estimated at 50 cc patient was returned to recovery room in stable condition

## 2018-08-04 NOTE — Progress Notes (Signed)
Pt. Has a body piercing on the outside of the vagina at the top. States it will not come out. Notified Dr. Noreene Larsson, stated it was okay and to put a piece of tape over it. Pt. Put  The tape over the piercing.

## 2018-08-04 NOTE — Transfer of Care (Signed)
Immediate Anesthesia Transfer of Care Note  Patient: Melanie Reid  Procedure(s) Performed: Right Lumbar five Sacral one Microdiscectomy (Right Back)  Patient Location: PACU  Anesthesia Type:General  Level of Consciousness: awake and alert   Airway & Oxygen Therapy: Patient Spontanous Breathing and Patient connected to nasal cannula oxygen  Post-op Assessment: Report given to RN and Post -op Vital signs reviewed and stable  Post vital signs: Reviewed and stable  Last Vitals:  Vitals Value Taken Time  BP 114/55 08/04/2018  9:31 AM  Temp    Pulse 80 08/04/2018  9:32 AM  Resp 21 08/04/2018  9:32 AM  SpO2 100 % 08/04/2018  9:32 AM  Vitals shown include unvalidated device data.  Last Pain:  Vitals:   08/04/18 0557  TempSrc:   PainSc: 5          Complications: No apparent anesthesia complications

## 2018-08-04 NOTE — Discharge Summary (Signed)
Physician Discharge Summary  Patient ID: Melanie Reid MRN: [id number] DOB/AGE: [birth year] 53 y.o.  Admit date: 08/04/2018 Discharge date: 08/04/2018  Admission Diagnoses: Herniated nucleus pulposus L5-S1 right with right lumbar radiculopathy  Discharge Diagnoses: Herniated nucleus pulposus L5-S1 right with right lumbar radiculopathy, foraminal disc herniation Active Problems:   Herniated nucleus pulposus, L5-S1, right   Discharged Condition: good  Hospital Course: Patient was admitted to undergo surgical decompression of laterally herniated disc at L5-S1 on the right side.  She tolerated surgery well.  Consults: None  Significant Diagnostic Studies: None  Treatments: surgery: Microdiscectomy L5-S1 right with operating microscope microdissection technique  Discharge Exam: Blood pressure 129/73, pulse 69, temperature (!) 97.5 F (36.4 C), temperature source Oral, resp. rate 18, height 5\' 4"  (1.626 m), weight 74.5 kg, last menstrual period 07/22/2018, SpO2 99 %. Incision is clean and dry Station and gait are intact.  Disposition: Discharge disposition: 01-Home or Self Care       Discharge Instructions    Call MD for:  redness, tenderness, or signs of infection (pain, swelling, redness, odor or green/yellow discharge around incision site)   Complete by:  As directed    Call MD for:  severe uncontrolled pain   Complete by:  As directed    Call MD for:  temperature >100.4   Complete by:  As directed    Diet - low sodium heart healthy   Complete by:  As directed    Discharge instructions   Complete by:  As directed    Okay to shower. Do not apply salves or appointments to incision. No heavy lifting with the upper extremities greater than 15 pounds. May resume driving when not requiring pain medication and patient feels comfortable with doing so.   Incentive spirometry RT   Complete by:  As directed    Increase activity slowly   Complete by:  As directed      Allergies  as of 08/04/2018      Reactions   Tape Rash   Prefers paper tape      Medication List    TAKE these medications   apixaban 5 MG Tabs tablet Commonly known as:  ELIQUIS Take 2 tablets (10 mg total) by mouth 2 (two) times daily for 6 days.   atorvastatin 10 MG tablet Commonly known as:  LIPITOR Take 10 mg by mouth at bedtime.   chlorthalidone 25 MG tablet Commonly known as:  HYGROTON Take 25 mg by mouth daily.   diazepam 5 MG tablet Commonly known as:  VALIUM Take 1 tablet (5 mg total) by mouth every 6 (six) hours as needed for muscle spasms.   DULoxetine 60 MG capsule Commonly known as:  CYMBALTA Take 60 mg by mouth daily.   gabapentin 300 MG capsule Commonly known as:  NEURONTIN Take 300 mg by mouth 3 (three) times daily.   lisinopril 40 MG tablet Commonly known as:  PRINIVIL,ZESTRIL Take 40 mg by mouth daily.   oxyCODONE-acetaminophen 5-325 MG tablet Commonly known as:  PERCOCET/ROXICET Take 1-2 tablets by mouth every 3 (three) hours as needed for moderate pain or severe pain.   pantoprazole 40 MG tablet Commonly known as:  PROTONIX Take 40 mg by mouth daily.   tiZANidine 2 MG tablet Commonly known as:  ZANAFLEX Take 2 mg by mouth at bedtime.        Signed: Shary Key Najmah Carradine 08/04/2018, 3:09 PM

## 2018-08-04 NOTE — H&P (Signed)
Melanie Reid is an 53 y.o. female.   Chief Complaint: Back and right leg pain HPI: Patient is a 53 year old individual who rather suddenly developed significant pain in the right lower extremity little over a month ago.  She tried all manner of conservative treatment and has failed this and MRI demonstrates presence of a extraforaminal disc protrusion at L5-S1 on the right side.  She is now been advised regarding surgical decompression of this process.  Past Medical History:  Diagnosis Date  . Complication of anesthesia   . Diverticulitis   . GERD (gastroesophageal reflux disease)   . History of kidney stones   . PONV (postoperative nausea and vomiting)   . Ventral hernia     Past Surgical History:  Procedure Laterality Date  . CARPAL TUNNEL RELEASE Right   . COLONOSCOPY    . ESOPHAGOGASTRODUODENOSCOPY ENDOSCOPY    . KNEE ARTHROSCOPY    . TUBAL LIGATION      History reviewed. No pertinent family history. Social History:  reports that she has never smoked. She has never used smokeless tobacco. She reports current alcohol use. No history on file for drug.  Allergies:  Allergies  Allergen Reactions  . Tape Rash    Prefers paper tape    Medications Prior to Admission  Medication Sig Dispense Refill  . atorvastatin (LIPITOR) 10 MG tablet Take 10 mg by mouth at bedtime.    . chlorthalidone (HYGROTON) 25 MG tablet Take 25 mg by mouth daily.    . DULoxetine (CYMBALTA) 60 MG capsule Take 60 mg by mouth daily.     Marland Kitchen gabapentin (NEURONTIN) 300 MG capsule Take 300 mg by mouth 3 (three) times daily.    Marland Kitchen lisinopril (PRINIVIL,ZESTRIL) 40 MG tablet Take 40 mg by mouth daily.    . pantoprazole (PROTONIX) 40 MG tablet Take 40 mg by mouth daily.    Marland Kitchen tiZANidine (ZANAFLEX) 2 MG tablet Take 2 mg by mouth at bedtime.     Marland Kitchen apixaban (ELIQUIS) 5 MG TABS tablet Take 2 tablets (10 mg total) by mouth 2 (two) times daily for 6 days. (Patient not taking: Reported on 07/15/2018) 60 tablet 0     Results for orders placed or performed during the hospital encounter of 08/04/18 (from the past 48 hour(s))  Pregnancy, urine POC     Status: None   Collection Time: 08/04/18  5:48 AM  Result Value Ref Range   Preg Test, Ur NEGATIVE NEGATIVE    Comment:        THE SENSITIVITY OF THIS METHODOLOGY IS >24 mIU/mL    No results found.  Review of Systems  Constitutional: Negative.   HENT: Negative.   Eyes: Negative.   Respiratory: Negative.   Cardiovascular: Negative.   Gastrointestinal: Negative.   Genitourinary: Negative.   Musculoskeletal: Positive for back pain.  Skin: Negative.   Neurological: Positive for tingling and weakness.       Right leg pain  Endo/Heme/Allergies: Negative.   Psychiatric/Behavioral: Negative.     Blood pressure (!) 112/59, pulse 76, temperature 98.4 F (36.9 C), temperature source Oral, resp. rate (!) 22, last menstrual period 07/22/2018, SpO2 100 %. Physical Exam  Constitutional: She appears well-developed and well-nourished.  HENT:  Head: Normocephalic and atraumatic.  Eyes: Pupils are equal, round, and reactive to light. Conjunctivae are normal.  Neck: Normal range of motion. Neck supple.  Cardiovascular: Normal rate and regular rhythm.  Respiratory: Effort normal and breath sounds normal.  GI: Soft. Bowel sounds are normal.  Musculoskeletal:     Comments: Positive straight leg raising on the right at 15 degrees Patrick's maneuver is negative  Neurological:  Alert and oriented cranial nerve examination is within limits of normal upper extremity strength is normal reflexes are normal save for the right Achilles which is absent.  Deep tendon reflexes otherwise are normal strength is slightly decreased in the right tibialis anterior 4 out of 5.  Skin: Skin is warm and dry.  Psychiatric: She has a normal mood and affect. Her behavior is normal. Judgment and thought content normal.     Assessment/Plan Herniated nucleus pulposus foraminal  L5-S1 on the right with right lumbar radiculopathy.  Plan: Microdiscectomy L5-S1 right  Stefani DamaHenry J Javarri Segal, MD 08/04/2018, 7:44 AM

## 2018-08-04 NOTE — Progress Notes (Signed)
Patient ID: Melanie Reid, female   DOB: [birth year], 53 y.o.   MRN: [id number] Vital signs are stable motor function is intact dressing is clean dry patient is ambulatory ready for discharge

## 2018-08-04 NOTE — Progress Notes (Signed)
Patient is discharged from room 3C07 at this time. Alert and in stable condition. IV site d/c'd and instructions read to patient and spouse with understanding verbalized. Left unit via wheelchair with all belongings at side. 

## 2018-08-04 NOTE — Anesthesia Procedure Notes (Signed)
Procedure Name: Intubation Performed by: Milford Cage, CRNA Pre-anesthesia Checklist: Patient identified, Emergency Drugs available, Suction available and Patient being monitored Patient Re-evaluated:Patient Re-evaluated prior to induction Oxygen Delivery Method: Circle System Utilized Preoxygenation: Pre-oxygenation with 100% oxygen Induction Type: IV induction Ventilation: Mask ventilation without difficulty and Oral airway inserted - appropriate to patient size Laryngoscope Size: Mac and 3 Grade View: Grade II Tube type: Oral Tube size: 7.0 mm Number of attempts: 1 Airway Equipment and Method: Stylet and Oral airway Placement Confirmation: ETT inserted through vocal cords under direct vision,  positive ETCO2 and breath sounds checked- equal and bilateral Secured at: 21 cm Tube secured with: Tape Dental Injury: Teeth and Oropharynx as per pre-operative assessment

## 2018-08-05 ENCOUNTER — Encounter (HOSPITAL_COMMUNITY): Payer: Self-pay | Admitting: Neurological Surgery

## 2019-04-06 ENCOUNTER — Encounter: Payer: Self-pay | Admitting: *Deleted

## 2019-04-06 ENCOUNTER — Telehealth: Payer: Self-pay | Admitting: *Deleted

## 2019-04-06 ENCOUNTER — Ambulatory Visit: Payer: Medicaid Other | Admitting: Diagnostic Neuroimaging

## 2019-04-06 NOTE — Telephone Encounter (Signed)
Patient was no show for new patient appointment today. 

## 2019-04-07 ENCOUNTER — Encounter: Payer: Self-pay | Admitting: Diagnostic Neuroimaging

## 2019-07-09 ENCOUNTER — Encounter (HOSPITAL_BASED_OUTPATIENT_CLINIC_OR_DEPARTMENT_OTHER): Payer: Self-pay | Admitting: Specialist

## 2019-07-09 ENCOUNTER — Other Ambulatory Visit: Payer: Self-pay

## 2019-07-09 NOTE — Progress Notes (Signed)
Spoke w/ via phone for pre-op interview--- PT Lab needs dos--  Istat 8 , Urine preg, EKG         Lab results------ no COVID test ------ 07-10-2019 @ 1200 Arrive at ------- 0815 NPO after ------ MN Medications to take morning of surgery ----- Nexium, Cymbalta w/ sips of water Diabetic medication ----- n/a Patient Special Instructions ----- n/a Pre-Op special Istructions ----- n/a Patient verbalized understanding of instructions that were given at this phone interview. Patient denies shortness of breath, chest pain, fever, cough a this phone interview.

## 2019-07-09 NOTE — H&P (View-Only) (Signed)
Spoke w/ via phone for pre-op interview--- PT Lab needs dos--  Istat 8 , Urine preg, EKG         Lab results------ no COVID test ------ 07-10-2019 @ 1200 Arrive at ------- 0815 NPO after ------ MN Medications to take morning of surgery ----- Nexium, Cymbalta w/ sips of water Diabetic medication ----- n/a Patient Special Instructions ----- n/a Pre-Op special Istructions ----- n/a Patient verbalized understanding of instructions that were given at this phone interview. Patient denies shortness of breath, chest pain, fever, cough a this phone interview. 

## 2019-07-10 ENCOUNTER — Other Ambulatory Visit (HOSPITAL_COMMUNITY)
Admission: RE | Admit: 2019-07-10 | Discharge: 2019-07-10 | Disposition: A | Discharge status: Home / Self Care | Payer: Medicaid Other | Source: Ambulatory Visit | Attending: Specialist | Admitting: Specialist

## 2019-07-10 DIAGNOSIS — Z01812 Encounter for preprocedural laboratory examination: Secondary | ICD-10-CM | POA: Insufficient documentation

## 2019-07-10 DIAGNOSIS — Z20822 Contact with and (suspected) exposure to covid-19: Secondary | ICD-10-CM | POA: Diagnosis not present

## 2019-07-11 LAB — NOVEL CORONAVIRUS, NAA (HOSP ORDER, SEND-OUT TO REF LAB; TAT 18-24 HRS): SARS-CoV-2, NAA: NOT DETECTED

## 2019-07-14 ENCOUNTER — Ambulatory Visit (HOSPITAL_BASED_OUTPATIENT_CLINIC_OR_DEPARTMENT_OTHER)
Admission: RE | Admit: 2019-07-14 | Discharge: 2019-07-14 | Disposition: A | Discharge status: Home / Self Care | Payer: Medicaid Other | Attending: Specialist | Admitting: Specialist

## 2019-07-14 ENCOUNTER — Encounter (HOSPITAL_BASED_OUTPATIENT_CLINIC_OR_DEPARTMENT_OTHER): Payer: Self-pay | Admitting: Specialist

## 2019-07-14 ENCOUNTER — Encounter (HOSPITAL_BASED_OUTPATIENT_CLINIC_OR_DEPARTMENT_OTHER)
Admission: RE | Disposition: A | Discharge status: Home / Self Care | Payer: Self-pay | Source: Home / Self Care | Attending: Specialist

## 2019-07-14 ENCOUNTER — Ambulatory Visit (HOSPITAL_BASED_OUTPATIENT_CLINIC_OR_DEPARTMENT_OTHER): Payer: Medicaid Other | Admitting: Anesthesiology

## 2019-07-14 ENCOUNTER — Other Ambulatory Visit: Payer: Self-pay

## 2019-07-14 DIAGNOSIS — S83512A Sprain of anterior cruciate ligament of left knee, initial encounter: Secondary | ICD-10-CM | POA: Insufficient documentation

## 2019-07-14 DIAGNOSIS — S83212A Bucket-handle tear of medial meniscus, current injury, left knee, initial encounter: Secondary | ICD-10-CM | POA: Insufficient documentation

## 2019-07-14 DIAGNOSIS — S83282A Other tear of lateral meniscus, current injury, left knee, initial encounter: Secondary | ICD-10-CM | POA: Diagnosis not present

## 2019-07-14 DIAGNOSIS — Z79899 Other long term (current) drug therapy: Secondary | ICD-10-CM | POA: Diagnosis not present

## 2019-07-14 DIAGNOSIS — X58XXXA Exposure to other specified factors, initial encounter: Secondary | ICD-10-CM | POA: Diagnosis not present

## 2019-07-14 DIAGNOSIS — K219 Gastro-esophageal reflux disease without esophagitis: Secondary | ICD-10-CM | POA: Insufficient documentation

## 2019-07-14 DIAGNOSIS — Z86718 Personal history of other venous thrombosis and embolism: Secondary | ICD-10-CM | POA: Insufficient documentation

## 2019-07-14 DIAGNOSIS — E785 Hyperlipidemia, unspecified: Secondary | ICD-10-CM | POA: Diagnosis not present

## 2019-07-14 DIAGNOSIS — I10 Essential (primary) hypertension: Secondary | ICD-10-CM | POA: Diagnosis not present

## 2019-07-14 HISTORY — DX: Personal history of other diseases of the respiratory system: Z87.09

## 2019-07-14 HISTORY — DX: Personal history of other diseases of the digestive system: Z87.19

## 2019-07-14 HISTORY — DX: Sprain of anterior cruciate ligament of left knee, initial encounter: S83.512A

## 2019-07-14 HISTORY — DX: Unspecified tear of unspecified meniscus, current injury, left knee, initial encounter: S83.207A

## 2019-07-14 HISTORY — DX: Unspecified osteoarthritis, unspecified site: M19.90

## 2019-07-14 HISTORY — DX: Personal history of other venous thrombosis and embolism: Z86.718

## 2019-07-14 HISTORY — PX: KNEE ARTHROSCOPY WITH ANTERIOR CRUCIATE LIGAMENT (ACL) REPAIR: SHX5644

## 2019-07-14 LAB — POCT I-STAT, CHEM 8
BUN: 31 mg/dL — ABNORMAL HIGH (ref 6–20)
Calcium, Ion: 1.25 mmol/L (ref 1.15–1.40)
Chloride: 98 mmol/L (ref 98–111)
Creatinine, Ser: 1.7 mg/dL — ABNORMAL HIGH (ref 0.44–1.00)
Glucose, Bld: 88 mg/dL (ref 70–99)
HCT: 35 % — ABNORMAL LOW (ref 36.0–46.0)
Hemoglobin: 11.9 g/dL — ABNORMAL LOW (ref 12.0–15.0)
Potassium: 3.8 mmol/L (ref 3.5–5.1)
Sodium: 135 mmol/L (ref 135–145)
TCO2: 25 mmol/L (ref 22–32)

## 2019-07-14 LAB — POCT PREGNANCY, URINE: Preg Test, Ur: NEGATIVE

## 2019-07-14 SURGERY — KNEE ARTHROSCOPY WITH ANTERIOR CRUCIATE LIGAMENT (ACL) REPAIR
Anesthesia: Spinal | Site: Knee | Laterality: Left

## 2019-07-14 MED ORDER — FENTANYL CITRATE (PF) 100 MCG/2ML IJ SOLN
INTRAMUSCULAR | Status: AC
  Filled 2019-07-14: qty 2

## 2019-07-14 MED ORDER — FENTANYL CITRATE (PF) 100 MCG/2ML IJ SOLN
25.0000 ug | INTRAMUSCULAR | Status: DC | PRN
  Filled 2019-07-14: qty 1

## 2019-07-14 MED ORDER — FENTANYL CITRATE (PF) 100 MCG/2ML IJ SOLN
INTRAMUSCULAR | Status: DC | PRN
  Administered 2019-07-14 (×4): 25 ug via INTRAVENOUS

## 2019-07-14 MED ORDER — FENTANYL CITRATE (PF) 100 MCG/2ML IJ SOLN
100.0000 ug | Freq: Once | INTRAMUSCULAR | Status: AC
  Administered 2019-07-14: 100 ug via INTRAVENOUS
  Filled 2019-07-14: qty 2

## 2019-07-14 MED ORDER — CEFAZOLIN SODIUM-DEXTROSE 2-4 GM/100ML-% IV SOLN
2.0000 g | INTRAVENOUS | Status: AC
  Administered 2019-07-14: 10:00:00 2 g via INTRAVENOUS
  Filled 2019-07-14: qty 100

## 2019-07-14 MED ORDER — OXYCODONE HCL 5 MG PO TABS
ORAL_TABLET | ORAL | Status: AC
  Filled 2019-07-14: qty 1

## 2019-07-14 MED ORDER — OXYCODONE HCL 5 MG PO TABS
5.0000 mg | ORAL_TABLET | Freq: Once | ORAL | Status: AC | PRN
  Administered 2019-07-14: 5 mg via ORAL
  Filled 2019-07-14: qty 1

## 2019-07-14 MED ORDER — OXYCODONE HCL 5 MG PO TABS: 5.0000 mg | ORAL_TABLET | ORAL | 0 refills | Status: AC | PRN

## 2019-07-14 MED ORDER — MIDAZOLAM HCL 2 MG/2ML IJ SOLN
INTRAMUSCULAR | Status: DC | PRN
  Administered 2019-07-14 (×2): 1 mg via INTRAVENOUS

## 2019-07-14 MED ORDER — OXYCODONE HCL 5 MG/5ML PO SOLN
5.0000 mg | Freq: Once | ORAL | Status: AC | PRN
  Filled 2019-07-14: qty 5

## 2019-07-14 MED ORDER — PROPOFOL 500 MG/50ML IV EMUL
INTRAVENOUS | Status: AC
  Filled 2019-07-14: qty 50

## 2019-07-14 MED ORDER — CEPHALEXIN 500 MG PO CAPS: 500.0000 mg | ORAL_CAPSULE | Freq: Four times a day (QID) | ORAL | 0 refills | Status: AC

## 2019-07-14 MED ORDER — CLONIDINE HCL (ANALGESIA) 100 MCG/ML EP SOLN
EPIDURAL | Status: DC | PRN
  Administered 2019-07-14: 100 ug

## 2019-07-14 MED ORDER — MIDAZOLAM HCL 2 MG/2ML IJ SOLN
INTRAMUSCULAR | Status: AC
  Filled 2019-07-14: qty 2

## 2019-07-14 MED ORDER — CEFAZOLIN SODIUM-DEXTROSE 2-4 GM/100ML-% IV SOLN
INTRAVENOUS | Status: AC
  Filled 2019-07-14: qty 100

## 2019-07-14 MED ORDER — PROPOFOL 10 MG/ML IV BOLUS
INTRAVENOUS | Status: AC
  Filled 2019-07-14: qty 20

## 2019-07-14 MED ORDER — METHOCARBAMOL 500 MG PO TABS: 500.0000 mg | ORAL_TABLET | Freq: Four times a day (QID) | ORAL | 0 refills | Status: DC

## 2019-07-14 MED ORDER — POVIDONE-IODINE 10 % EX SWAB
2.0000 "application " | Freq: Once | CUTANEOUS | Status: DC
  Filled 2019-07-14: qty 2

## 2019-07-14 MED ORDER — CHLORHEXIDINE GLUCONATE 4 % EX LIQD
60.0000 mL | Freq: Once | CUTANEOUS | Status: DC
  Filled 2019-07-14: qty 118

## 2019-07-14 MED ORDER — MIDAZOLAM HCL 2 MG/2ML IJ SOLN
2.0000 mg | Freq: Once | INTRAMUSCULAR | Status: AC
  Administered 2019-07-14: 2 mg via INTRAVENOUS
  Filled 2019-07-14: qty 2

## 2019-07-14 MED ORDER — LACTATED RINGERS IV SOLN
INTRAVENOUS | Status: DC
  Administered 2019-07-14: 1000 mL via INTRAVENOUS
  Filled 2019-07-14: qty 1000

## 2019-07-14 MED ORDER — MEPIVACAINE HCL (PF) 2 % IJ SOLN
INTRAMUSCULAR | Status: DC | PRN
  Administered 2019-07-14: 52 mg via INTRATHECAL

## 2019-07-14 MED ORDER — PROPOFOL 500 MG/50ML IV EMUL
INTRAVENOUS | Status: DC | PRN
  Administered 2019-07-14: 100 ug/kg/min via INTRAVENOUS

## 2019-07-14 MED ORDER — SODIUM CHLORIDE 0.9 % IR SOLN
Status: DC | PRN
  Administered 2019-07-14: 30000 mL

## 2019-07-14 MED ORDER — ONDANSETRON HCL 4 MG/2ML IJ SOLN
4.0000 mg | Freq: Once | INTRAMUSCULAR | Status: DC | PRN
  Filled 2019-07-14: qty 2

## 2019-07-14 MED ORDER — ONDANSETRON HCL 4 MG PO TABS: 4.0000 mg | ORAL_TABLET | Freq: Three times a day (TID) | ORAL | 1 refills | Status: AC | PRN

## 2019-07-14 MED ORDER — ROPIVACAINE HCL 7.5 MG/ML IJ SOLN
INTRAMUSCULAR | Status: DC | PRN
  Administered 2019-07-14: 20 mL via PERINEURAL

## 2019-07-14 SURGICAL SUPPLY — 84 items
ALLOGRAFT GRFTLNK IMPLANT SYST (Anchor) IMPLANT
BANDAGE ESMARK 6X9 LF (GAUZE/BANDAGES/DRESSINGS) ×1 IMPLANT
BLADE EXCALIBUR 5.0MM X 13CM (MISCELLANEOUS) ×2
BLADE EXCALIBUR 5.0X13 (MISCELLANEOUS) ×2 IMPLANT
BLADE SURG 10 STRL SS (BLADE) ×3 IMPLANT
BLADE SURG 15 STRL LF DISP TIS (BLADE) ×1 IMPLANT
BLADE SURG 15 STRL SS (BLADE) ×2
BNDG COBAN SELF-ADHERE TAN 2X5 (GAUZE/BANDAGES/DRESSINGS) IMPLANT
BNDG COHESIVE 2X5 TAN NS LF (GAUZE/BANDAGES/DRESSINGS) ×3 IMPLANT
BNDG COHESIVE 6X5 TAN NS LF (GAUZE/BANDAGES/DRESSINGS) ×3 IMPLANT
BNDG ESMARK 6X9 LF (GAUZE/BANDAGES/DRESSINGS) ×3
BNDG GAUZE ELAST 4 BULKY (GAUZE/BANDAGES/DRESSINGS) ×3 IMPLANT
BONE MATRIX DEMINERALIZED 1CC (Bone Implant) ×4 IMPLANT
BURR OVAL 8 FLU 4.0MM X 13CM (MISCELLANEOUS) ×1
BURR OVAL 8 FLU 4.0X13 (MISCELLANEOUS) ×1 IMPLANT
CANISTER SUCT 1200ML W/VALVE (MISCELLANEOUS) ×3 IMPLANT
CLOSURE WOUND 1/2 X4 (GAUZE/BANDAGES/DRESSINGS)
COVER BACK TABLE 60X90IN (DRAPES) ×3 IMPLANT
COVER WAND RF STERILE (DRAPES) ×6 IMPLANT
CUFF TOURN SGL QUICK 34 (TOURNIQUET CUFF)
CUFF TRNQT CYL 34X4.125X (TOURNIQUET CUFF) ×1 IMPLANT
DRAPE ARTHROSCOPY W/POUCH 114 (DRAPES) ×3 IMPLANT
DRAPE INCISE IOBAN 66X45 STRL (DRAPES) ×3 IMPLANT
DRAPE OEC MINIVIEW 54X84 (DRAPES) ×3 IMPLANT
DRAPE SHEET LG 3/4 BI-LAMINATE (DRAPES) ×5 IMPLANT
DRAPE U-SHAPE 47X51 STRL (DRAPES) ×3 IMPLANT
DRSG PAD ABDOMINAL 8X10 ST (GAUZE/BANDAGES/DRESSINGS) ×4 IMPLANT
DURAPREP 26ML APPLICATOR (WOUND CARE) ×3 IMPLANT
ELECT NDL BLADE 2-5/6 (NEEDLE) IMPLANT
ELECT NEEDLE BLADE 2-5/6 (NEEDLE) IMPLANT
ELECT REM PT RETURN 9FT ADLT (ELECTROSURGICAL) ×3
ELECTRODE REM PT RTRN 9FT ADLT (ELECTROSURGICAL) ×1 IMPLANT
EXCALIBUR 3.8MM X 13CM (MISCELLANEOUS) ×2 IMPLANT
FIBERSTICK 2 (SUTURE) IMPLANT
GAUZE SPONGE 4X4 12PLY STRL (GAUZE/BANDAGES/DRESSINGS) ×4 IMPLANT
GAUZE XEROFORM 1X8 LF (GAUZE/BANDAGES/DRESSINGS) ×3 IMPLANT
GLOVE BIO SURGEON STRL SZ7.5 (GLOVE) ×3 IMPLANT
GLOVE BIO SURGEON STRL SZ8 (GLOVE) ×3 IMPLANT
GLOVE BIOGEL PI IND STRL 7.5 (GLOVE) IMPLANT
GLOVE BIOGEL PI INDICATOR 7.5 (GLOVE) ×2
GLOVE INDICATOR 8.0 STRL GRN (GLOVE) ×6 IMPLANT
GOWN STRL REUS W/TWL XL LVL3 (GOWN DISPOSABLE) ×6 IMPLANT
GRAFT TISS 60-80 FRZN TENDON (Tissue) IMPLANT
IMP SYS 2ND FIX PEEK 4.75X19.1 (Miscellaneous) ×3 IMPLANT
IMPL SYS 2ND FX PEEK 4.75X19.1 (Miscellaneous) IMPLANT
IV NS IRRIG 3000ML ARTHROMATIC (IV SOLUTION) ×24 IMPLANT
KIT TURNOVER CYSTO (KITS) ×3 IMPLANT
KNEE WRAP E Z 3 GEL PACK (MISCELLANEOUS) ×3 IMPLANT
MANIFOLD NEPTUNE II (INSTRUMENTS) ×3 IMPLANT
NDL STIMUPLEX 21X4 (NEEDLE) IMPLANT
NEEDLE HYPO 22GX1.5 SAFETY (NEEDLE) IMPLANT
NEEDLE STIMUPLEX 21X4 (NEEDLE) ×3 IMPLANT
PACK ARTHROSCOPY DSU (CUSTOM PROCEDURE TRAY) ×3 IMPLANT
PACK BASIN DAY SURGERY FS (CUSTOM PROCEDURE TRAY) ×3 IMPLANT
PAD ARMBOARD 7.5X6 YLW CONV (MISCELLANEOUS) IMPLANT
PENCIL BUTTON HOLSTER BLD 10FT (ELECTRODE) IMPLANT
PK GRAFTLINK ALLO IMPLANT SYST (Anchor) ×3 IMPLANT
PORT APPOLLO RF 90DEGREE MULTI (SURGICAL WAND) ×4 IMPLANT
SET PAD KNEE POSITIONER (MISCELLANEOUS) ×3 IMPLANT
SPONGE LAP 4X18 RFD (DISPOSABLE) ×3 IMPLANT
STRIP CLOSURE SKIN 1/2X4 (GAUZE/BANDAGES/DRESSINGS) IMPLANT
SUCTION FRAZIER HANDLE 10FR (MISCELLANEOUS) ×2
SUCTION TUBE FRAZIER 10FR DISP (MISCELLANEOUS) ×1 IMPLANT
SUT 2 FIBERLOOP 20 STRT BLUE (SUTURE)
SUT ETHILON 4 0 PS 2 18 (SUTURE) ×3 IMPLANT
SUT FIBERWIRE #2 38 REV NDL BL (SUTURE)
SUT FIBERWIRE #2 38 T-5 BLUE (SUTURE)
SUT MNCRL AB 3-0 PS2 18 (SUTURE) ×3 IMPLANT
SUT VIC AB 0 CT2 27 (SUTURE) ×6 IMPLANT
SUT VIC AB 2-0 CT2 27 (SUTURE) ×3 IMPLANT
SUT VIC AB 2-0 SH 27 (SUTURE) ×4
SUT VIC AB 2-0 SH 27XBRD (SUTURE) ×2 IMPLANT
SUTURE 2 FIBERLOOP 20 STRT BLU (SUTURE) IMPLANT
SUTURE FIBERWR #2 38 T-5 BLUE (SUTURE) IMPLANT
SUTURE FIBERWR#2 38 REV NDL BL (SUTURE) IMPLANT
SUTURE TIGERSTICK 2 TIGERWIR 2 (MISCELLANEOUS) IMPLANT
SYR CONTROL 10ML LL (SYRINGE) ×3 IMPLANT
TIGERSTICK 2 TIGERWIRE 2 (MISCELLANEOUS)
TISSUE GRAFTLINK FGL (Tissue) ×3 IMPLANT
TOWEL OR 17X26 10 PK STRL BLUE (TOWEL DISPOSABLE) ×8 IMPLANT
TUBE CONNECTING 12'X1/4 (SUCTIONS) ×2
TUBE CONNECTING 12X1/4 (SUCTIONS) ×4 IMPLANT
TUBING ARTHROSCOPY IRRIG 16FT (MISCELLANEOUS) ×3 IMPLANT
WATER STERILE IRR 500ML POUR (IV SOLUTION) ×13 IMPLANT

## 2019-07-14 NOTE — Interval H&P Note (Signed)
History and Physical Interval Note:  07/14/2019 9:16 AM  Melanie Reid  has presented today for surgery, with the diagnosis of Left knee torn ACL  medial and lateral torn mensicus.  The various methods of treatment have been discussed with the patient and family. After consideration of risks, benefits and other options for treatment, the patient has consented to  Procedure(s) with comments: KNEE ARTHROSCOPY WITH ANTERIOR CRUCIATE LIGAMENT (ACL) Revision partial medial and lateral meniscectomy chondroplasty (Left) - adductor canal with IV sedation as a surgical intervention.  The patient's history has been reviewed, patient examined, no change in status, stable for surgery.  I have reviewed the patient's chart and labs.  Questions were answered to the patient's satisfaction.     [name] ANDREW

## 2019-07-14 NOTE — Progress Notes (Signed)
AssistedDr. Houser with left, ultrasound guided, adductor canal block. Side rails up, monitors on throughout procedure. See vital signs in flow sheet. Tolerated Procedure well.  

## 2019-07-14 NOTE — Op Note (Signed)
Melanie Reid, PFEFFERLE MEDICAL RECORD IO:[id number] ACCOUNT [id number] DATE OF BIRTH:1966/03/29 FACILITY: [facility] LOCATION: [facility] PHYSICIAN:Ruth Tully [name], MD  OPERATIVE REPORT  DATE OF PROCEDURE:  07/14/2019  PREOPERATIVE DIAGNOSES:   1.  Left knee and complement stretched anterior cruciate ligament graft. 2.  Bucket handle tear, medial meniscus. 3.  Complex tear lateral meniscus.  POSTOPERATIVE DIAGNOSES:   1.  Left knee and complement stretched anterior cruciate ligament graft. 2.  Bucket handle tear, medial meniscus. 3.  Complex tear lateral meniscus. 4.  Chondromalacia grade III tricompartmental, focal.  PROCEDURES:   1.  Left knee arthroscopic assisted revision allograft anterior cruciate ligament reconstruction. 2.  Partial medial meniscectomy. 3.  Partial lateral meniscectomy. 4.  Chondroplasty, tricompartmental. 5.  Allograft bone matrix to femoral and tibial tunnels.  SURGEON:  Erasmo Leventhal, MD  ASSISTANT:  [name], PA-C.  ANESTHESIA:  Spinal with IV sedation, adductor canal block.  ESTIMATED BLOOD LOSS:  Minimal.  DRAINS:  None.  COMPLICATIONS:  None.  TOURNIQUET TIME:  100 minutes at 300 mmHg.  DESCRIPTION OF PROCEDURE:  The patient was counseled in the holding area.  The correct side was identified, marked and signed appropriately.  Chart was reviewed.  IV started, sedation given, adductor canal block was administered per the anesthesia staff.  She was then taken to the operating room and placed supine and sitting upright where the spinal anesthetic was administered.  She was then laid supine, given IV sedation.  All extremities were well padded.  Left lower extremity was elevated, prepped and  draped in a sterile fashion.  Time-out done, confirming the left side.  Arthroscopic portals were established, proximal medial, inferomedial, inferolateral.  Diagnostic arthroscopy revealed a complete rupture of the ACL graft and also displaced  bucket  handle tear of medial meniscus, which was complex in nature.  A shaver was introduced.  The old ACL fibers were debrided.  The tunnel was found to be anterior and vertical.  Notchplasty was performed.  Over-the-top position was confirmed.  Patellofemoral  joint had grade III chondromalacia.  Chondroplasty was performed with a shaver back to a stable base.  Medial compartment and medial meniscus was irreparable.  Utilizing basket, motorized shaver and cautery, a partial meniscectomy was performed back to  stable base, properly beveled and contoured and she also had a chondroplasty of the medial compartment with a shaver.  Lateral side revealed some chondromalacia also as noted above.  Light chondroplasty performed with a shaver.  Lateral meniscus showed a  complex tear.  Using basket, motorized shaver, cautery, partial lateral meniscectomy was performed back to a stable base, properly beveled and contoured.  The Arthrex guide was sitting in the over-the-top position.  A small stab incision was made distal  lateral.  An Arthrex FlipCutter was put in anatomic ACL footprint and a retrosocket was performed.  The socket was divergent from the other socket and in a different position.  We also had chosen an age-sized appropriate allograft.  It was prepared on  the back table by [name], PA-C for all-inside ACL reconstruction.  The graft was then delivered into the knee.  I put the femoral button on the lateral femoral cortex, confirmed arthroscopically, palpable and visual.  I then put  StimuBlast  into the femoral socket and then put the graft into the socket.  It sat in retrograde fashion, stabilizing into the tibial socket in antegrade fashion.  Tensioning the graft on both sides at 30 degrees of flexion, placed another  button on the tibial  side, for an all-inside ACL.  The graft had excellent orientation and tension.  The sutures were tied.  The knee was placed in full extension and then the  internal brace was placed into a SwiveLock.  The knee was put through range of motion.  The graft  was stable on both sides.  The knee was clinically stable.  Graft had excellent orientation and tension.  Excess sutures were removed.  The portals were closed with 4-0 nylon suture.  The small incision with Vicryl, followed by subcutaneous Vicryl, skin  with nylon.  We also made a very tiny incision anteromedial for the tibial socket.  A sterile dressing applied to the knee, had tourniquet compression.  Tourniquet was deflated and normal circulation to the foot and ankle in the case.  She was then  placed into an ice pack and T-ROM brace in full extension.  She was awakened and taken from the operating room to the PACU in stable condition.  There were no complications or problems.  She will be stabilized in PACU and discharged to home.  To help with patient positioning, prepping and draping, technical surgical assistance throughout the entire case, wound closure graft preparation, [name]' assistance was needed throughout the entire case.  VN/NUANCE  D:07/14/2019 T:07/14/2019 JOB:[id number]

## 2019-07-14 NOTE — Anesthesia Procedure Notes (Signed)
Spinal  Patient location during procedure: OR Start time: 07/14/2019 9:35 AM End time: 07/14/2019 9:41 AM Staffing Performed: anesthesiologist  Anesthesiologist: Trevor Iha, MD Preanesthetic Checklist Completed: patient identified, IV checked, site marked, risks and benefits discussed, surgical consent, monitors and equipment checked, pre-op evaluation and timeout performed Spinal Block Patient position: sitting Prep: Betadine Patient monitoring: heart rate, cardiac monitor, continuous pulse ox and blood pressure Approach: midline Location: L3-4 Injection technique: single-shot Needle Needle type: Sprotte  Needle gauge: 24 G Needle length: 9 cm Needle insertion depth: 7 cm Assessment Sensory level: T4 Additional Notes 1 attempt . Pt tolerated procedure well.

## 2019-07-14 NOTE — Discharge Instructions (Signed)
NON WEIGHT BEARING LEFT LEG.  Post Anesthesia Home Care Instructions  Activity: Get plenty of rest for the remainder of the day. A responsible individual must stay with you for 24 hours following the procedure.  For the next 24 hours, DO NOT: -Drive a car -Advertising copywriter -Drink alcoholic beverages -Take any medication unless instructed by your physician -Make any legal decisions or sign important papers.  Meals: Start with liquid foods such as gelatin or soup. Progress to regular foods as tolerated. Avoid greasy, spicy, heavy foods. If nausea and/or vomiting occur, drink only clear liquids until the nausea and/or vomiting subsides. Call your physician if vomiting continues.  Special Instructions/Symptoms: Your throat may feel dry or sore from the anesthesia or the breathing tube placed in your throat during surgery. If this causes discomfort, gargle with warm salt water. The discomfort should disappear within 24 hours.  Regional Anesthesia Blocks  1. Numbness or the inability to move the "blocked" extremity may last from 3-48 hours after placement. The length of time depends on the medication injected and your individual response to the medication. If the numbness is not going away after 48 hours, call your surgeon.  2. The extremity that is blocked will need to be protected until the numbness is gone and the  Strength has returned. Because you cannot feel it, you will need to take extra care to avoid injury. Because it may be weak, you may have difficulty moving it or using it. You may not know what position it is in without looking at it while the block is in effect.  3. For blocks in the legs and feet, returning to weight bearing and walking needs to be done carefully. You will need to wait until the numbness is entirely gone and the strength has returned. You should be able to move your leg and foot normally before you try and bear weight or walk. You will need someone to be with you  when you first try to ensure you do not fall and possibly risk injury.  4. Bruising and tenderness at the needle site are common side effects and will resolve in a few days.  5. Persistent numbness or new problems with movement should be communicated to the surgeon or the [facility] [phone] [facility] [phone]).

## 2019-07-14 NOTE — Anesthesia Procedure Notes (Signed)
Anesthesia Regional Block: Adductor canal block   Pre-Anesthetic Checklist: ,, timeout performed, Correct Patient, Correct Site, Correct Laterality, Correct Procedure, Correct Position, site marked, Risks and benefits discussed,  Surgical consent,  Pre-op evaluation,  At surgeon's request and post-op pain management  Laterality: Lower and Left  Prep: chloraprep       Needles:  Injection technique: Single-shot  Needle Type: Echogenic Needle     Needle Length: 9cm  Needle Gauge: 22     Additional Needles:   Procedures:,,,, ultrasound used (permanent image in chart),,,,  Narrative:  Start time: 07/14/2019 8:49 AM End time: 07/14/2019 8:55 AM Injection made incrementally with aspirations every 5 mL.  Performed by: Personally  Anesthesiologist: Trevor Iha, MD  Additional Notes: Block assessed prior to surgery. Pt tolerated procedure well.

## 2019-07-14 NOTE — Op Note (Signed)
Dictated#[id number]
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
## 2019-07-14 NOTE — Anesthesia Preprocedure Evaluation (Addendum)
Anesthesia Evaluation    Reviewed: Allergy & Precautions, Patient's Chart, lab work & pertinent test results  History of Anesthesia Complications (+) PONV  Airway Mallampati: II  TM Distance: >3 FB Neck ROM: Full    Dental no notable dental hx. (+) Teeth Intact, Dental Advisory Given   Pulmonary neg pulmonary ROS,    Pulmonary exam normal breath sounds clear to auscultation       Cardiovascular hypertension, Normal cardiovascular exam Rhythm:Regular Rate:Normal     Neuro/Psych negative neurological ROS  negative psych ROS   GI/Hepatic Neg liver ROS, GERD  Medicated,  Endo/Other  negative endocrine ROS  Renal/GU negative Renal ROS     Musculoskeletal  (+) Arthritis ,   Abdominal   Peds  Hematology   Anesthesia Other Findings   Reproductive/Obstetrics                            Anesthesia Physical Anesthesia Plan  ASA: II  Anesthesia Plan: Spinal   Post-op Pain Management:  Regional for Post-op pain   Induction:   PONV Risk Score and Plan: Treatment may vary due to age or medical condition and Ondansetron  Airway Management Planned: Nasal Cannula and Natural Airway  Additional Equipment:   Intra-op Plan:   Post-operative Plan:   Informed Consent:     Dental advisory given  Plan Discussed with:   Anesthesia Plan Comments:         Anesthesia Quick Evaluation

## 2019-07-15 NOTE — Anesthesia Postprocedure Evaluation (Signed)
Anesthesia Post Note  Patient: Melanie Reid  Procedure(s) Performed: KNEE ARTHROSCOPY WITH ANTERIOR CRUCIATE LIGAMENT (ACL) Revision partial medial and lateral meniscectomy chondroplasty (Left Knee)     Patient location during evaluation: Nursing Unit Anesthesia Type: Spinal Level of consciousness: oriented and awake and alert Pain management: pain level controlled Vital Signs Assessment: post-procedure vital signs reviewed and stable Respiratory status: spontaneous breathing and respiratory function stable Cardiovascular status: blood pressure returned to baseline and stable Postop Assessment: no headache, no backache, no apparent nausea or vomiting and patient able to bend at knees Anesthetic complications: no    Last Vitals:  Vitals:   07/14/19 1315 07/14/19 1345  BP: 114/76 128/71  Pulse: 73 60  Resp: 18 18  Temp:  36.4 C  SpO2: 99% 100%    Last Pain:  Vitals:   07/15/19 1009  TempSrc:   PainSc: 9    Pain Goal: Patients Stated Pain Goal: 8 (07/14/19 0910)                 Trevor Iha

## 2019-07-15 NOTE — Transfer of Care (Signed)
Immediate Anesthesia Transfer of Care Note  Patient: Melanie Reid  Procedure(s) Performed: KNEE ARTHROSCOPY WITH ANTERIOR CRUCIATE LIGAMENT (ACL) Revision partial medial and lateral meniscectomy chondroplasty (Left Knee)  Patient Location: PACU  Anesthesia Type:Spinal  Level of Consciousness: awake, alert , oriented and patient cooperative  Airway & Oxygen Therapy: Patient Spontanous Breathing and Patient connected to nasal cannula oxygen  Post-op Assessment: Report given to RN and Post -op Vital signs reviewed and stable  Post vital signs: Reviewed and stable  Last Vitals:  Vitals Value Taken Time  BP 128/71 07/14/19 1345  Temp 36.4 C 07/14/19 1345  Pulse 60 07/14/19 1345  Resp 18 07/14/19 1345  SpO2 100 % 07/14/19 1345    Last Pain:  Vitals:   07/14/19 1341  TempSrc:   PainSc: 4       Patients Stated Pain Goal: 8 (07/14/19 0910)  Complications: No apparent anesthesia complications

## 2019-07-27 DIAGNOSIS — Z8616 Personal history of COVID-19: Secondary | ICD-10-CM

## 2019-07-27 HISTORY — DX: Personal history of COVID-19: Z86.16

## 2019-08-22 ENCOUNTER — Encounter (HOSPITAL_COMMUNITY): Payer: Self-pay | Admitting: Emergency Medicine

## 2019-08-22 ENCOUNTER — Emergency Department (HOSPITAL_COMMUNITY): Payer: Medicaid Other

## 2019-08-22 ENCOUNTER — Other Ambulatory Visit: Payer: Self-pay

## 2019-08-22 ENCOUNTER — Inpatient Hospital Stay (HOSPITAL_COMMUNITY)
Admission: EM | Admit: 2019-08-22 | Discharge: 2019-08-22 | DRG: 177 | Discharge status: Against Medical Advice | Payer: Medicaid Other | Attending: Internal Medicine | Admitting: Internal Medicine

## 2019-08-22 DIAGNOSIS — U071 COVID-19: Secondary | ICD-10-CM | POA: Diagnosis present

## 2019-08-22 DIAGNOSIS — F1411 Cocaine abuse, in remission: Secondary | ICD-10-CM | POA: Diagnosis present

## 2019-08-22 DIAGNOSIS — E785 Hyperlipidemia, unspecified: Secondary | ICD-10-CM | POA: Diagnosis present

## 2019-08-22 DIAGNOSIS — Z86718 Personal history of other venous thrombosis and embolism: Secondary | ICD-10-CM | POA: Diagnosis not present

## 2019-08-22 DIAGNOSIS — M961 Postlaminectomy syndrome, not elsewhere classified: Secondary | ICD-10-CM | POA: Diagnosis present

## 2019-08-22 DIAGNOSIS — G5602 Carpal tunnel syndrome, left upper limb: Secondary | ICD-10-CM | POA: Diagnosis present

## 2019-08-22 DIAGNOSIS — M1710 Unilateral primary osteoarthritis, unspecified knee: Secondary | ICD-10-CM | POA: Diagnosis present

## 2019-08-22 DIAGNOSIS — R55 Syncope and collapse: Secondary | ICD-10-CM

## 2019-08-22 DIAGNOSIS — K219 Gastro-esophageal reflux disease without esophagitis: Secondary | ICD-10-CM | POA: Diagnosis present

## 2019-08-22 DIAGNOSIS — N179 Acute kidney failure, unspecified: Secondary | ICD-10-CM | POA: Diagnosis present

## 2019-08-22 DIAGNOSIS — J1282 Pneumonia due to coronavirus disease 2019: Secondary | ICD-10-CM | POA: Diagnosis present

## 2019-08-22 DIAGNOSIS — Z87442 Personal history of urinary calculi: Secondary | ICD-10-CM | POA: Diagnosis not present

## 2019-08-22 DIAGNOSIS — N182 Chronic kidney disease, stage 2 (mild): Secondary | ICD-10-CM | POA: Diagnosis present

## 2019-08-22 DIAGNOSIS — Z880 Allergy status to penicillin: Secondary | ICD-10-CM

## 2019-08-22 DIAGNOSIS — Z79899 Other long term (current) drug therapy: Secondary | ICD-10-CM | POA: Diagnosis not present

## 2019-08-22 DIAGNOSIS — E860 Dehydration: Secondary | ICD-10-CM | POA: Diagnosis present

## 2019-08-22 DIAGNOSIS — Z91048 Other nonmedicinal substance allergy status: Secondary | ICD-10-CM | POA: Diagnosis not present

## 2019-08-22 DIAGNOSIS — I129 Hypertensive chronic kidney disease with stage 1 through stage 4 chronic kidney disease, or unspecified chronic kidney disease: Secondary | ICD-10-CM | POA: Diagnosis present

## 2019-08-22 LAB — COMPREHENSIVE METABOLIC PANEL
ALT: 35 U/L (ref 0–44)
AST: 50 U/L — ABNORMAL HIGH (ref 15–41)
Albumin: 4 g/dL (ref 3.5–5.0)
Alkaline Phosphatase: 78 U/L (ref 38–126)
Anion gap: 13 (ref 5–15)
BUN: 33 mg/dL — ABNORMAL HIGH (ref 6–20)
CO2: 20 mmol/L — ABNORMAL LOW (ref 22–32)
Calcium: 9.1 mg/dL (ref 8.9–10.3)
Chloride: 98 mmol/L (ref 98–111)
Creatinine, Ser: 2.58 mg/dL — ABNORMAL HIGH (ref 0.44–1.00)
GFR calc Af Amer: 24 mL/min — ABNORMAL LOW (ref >60)
GFR calc non Af Amer: 21 mL/min — ABNORMAL LOW (ref >60)
Glucose, Bld: 112 mg/dL — ABNORMAL HIGH (ref 70–99)
Potassium: 3.6 mmol/L (ref 3.5–5.1)
Sodium: 131 mmol/L — ABNORMAL LOW (ref 135–145)
Total Bilirubin: 0.6 mg/dL (ref 0.3–1.2)
Total Protein: 7.7 g/dL (ref 6.5–8.1)

## 2019-08-22 LAB — CBC WITH DIFFERENTIAL/PLATELET
Abs Immature Granulocytes: 0.01 10*3/uL (ref 0.00–0.07)
Basophils Absolute: 0 10*3/uL (ref 0.0–0.1)
Basophils Relative: 0 %
Eosinophils Absolute: 0 10*3/uL (ref 0.0–0.5)
Eosinophils Relative: 0 %
HCT: 36.5 % (ref 36.0–46.0)
Hemoglobin: 12.4 g/dL (ref 12.0–15.0)
Immature Granulocytes: 0 %
Lymphocytes Relative: 23 %
Lymphs Abs: 0.6 10*3/uL — ABNORMAL LOW (ref 0.7–4.0)
MCH: 32 pg (ref 26.0–34.0)
MCHC: 34 g/dL (ref 30.0–36.0)
MCV: 94.3 fL (ref 80.0–100.0)
Monocytes Absolute: 0.2 10*3/uL (ref 0.1–1.0)
Monocytes Relative: 6 %
Neutro Abs: 1.8 10*3/uL (ref 1.7–7.7)
Neutrophils Relative %: 71 %
Platelets: DECREASED 10*3/uL (ref 150–400)
RBC: 3.87 MIL/uL (ref 3.87–5.11)
RDW: 12.6 % (ref 11.5–15.5)
WBC: 2.5 10*3/uL — ABNORMAL LOW (ref 4.0–10.5)
nRBC: 0 % (ref 0.0–0.2)

## 2019-08-22 LAB — TROPONIN I (HIGH SENSITIVITY): Troponin I (High Sensitivity): 7 ng/L (ref <18)

## 2019-08-22 LAB — I-STAT BETA HCG BLOOD, ED (MC, WL, AP ONLY): I-stat hCG, quantitative: <5 m[IU]/mL (ref <5)

## 2019-08-22 LAB — POC SARS CORONAVIRUS 2 AG -  ED: SARS Coronavirus 2 Ag: POSITIVE — AB

## 2019-08-22 MED ORDER — ATORVASTATIN CALCIUM 10 MG PO TABS: 10.0000 mg | ORAL_TABLET | Freq: Every day | ORAL | Status: DC

## 2019-08-22 MED ORDER — HEPARIN SODIUM (PORCINE) 5000 UNIT/ML IJ SOLN: 5000.0000 [IU] | Freq: Three times a day (TID) | INTRAMUSCULAR | Status: DC

## 2019-08-22 MED ORDER — ONDANSETRON HCL 4 MG/2ML IJ SOLN: 4.0000 mg | Freq: Four times a day (QID) | INTRAMUSCULAR | Status: DC | PRN

## 2019-08-22 MED ORDER — METHOCARBAMOL 500 MG PO TABS: 500.0000 mg | ORAL_TABLET | Freq: Four times a day (QID) | ORAL | Status: DC

## 2019-08-22 MED ORDER — ACETAMINOPHEN 325 MG PO TABS: 650.0000 mg | ORAL_TABLET | Freq: Four times a day (QID) | ORAL | Status: DC | PRN

## 2019-08-22 MED ORDER — GABAPENTIN 400 MG PO CAPS: 400.0000 mg | ORAL_CAPSULE | Freq: Three times a day (TID) | ORAL | Status: DC

## 2019-08-22 MED ORDER — ASCORBIC ACID 500 MG PO TABS: 500.0000 mg | ORAL_TABLET | Freq: Every day | ORAL | Status: DC

## 2019-08-22 MED ORDER — ACETAMINOPHEN 325 MG PO TABS
650.0000 mg | ORAL_TABLET | Freq: Once | ORAL | Status: AC
  Administered 2019-08-22: 650 mg via ORAL
  Filled 2019-08-22: qty 2

## 2019-08-22 MED ORDER — SODIUM CHLORIDE 0.9 % IV SOLN: INTRAVENOUS | Status: DC

## 2019-08-22 MED ORDER — IPRATROPIUM-ALBUTEROL 20-100 MCG/ACT IN AERS
1.0000 | INHALATION_SPRAY | Freq: Four times a day (QID) | RESPIRATORY_TRACT | Status: DC
  Filled 2019-08-22: qty 4

## 2019-08-22 MED ORDER — PANTOPRAZOLE SODIUM 40 MG PO TBEC: 40.0000 mg | DELAYED_RELEASE_TABLET | Freq: Every day | ORAL | Status: DC

## 2019-08-22 MED ORDER — BENZONATATE 100 MG PO CAPS: 200.0000 mg | ORAL_CAPSULE | Freq: Three times a day (TID) | ORAL | Status: DC

## 2019-08-22 MED ORDER — GUAIFENESIN-DM 100-10 MG/5ML PO SYRP: 10.0000 mL | ORAL_SOLUTION | ORAL | Status: DC | PRN

## 2019-08-22 MED ORDER — ONDANSETRON HCL 4 MG/2ML IJ SOLN
4.0000 mg | Freq: Once | INTRAMUSCULAR | Status: AC
  Administered 2019-08-22: 4 mg via INTRAVENOUS
  Filled 2019-08-22: qty 2

## 2019-08-22 MED ORDER — SENNOSIDES-DOCUSATE SODIUM 8.6-50 MG PO TABS: 1.0000 | ORAL_TABLET | Freq: Every evening | ORAL | Status: DC | PRN

## 2019-08-22 MED ORDER — HYDRALAZINE HCL 25 MG PO TABS: 25.0000 mg | ORAL_TABLET | Freq: Four times a day (QID) | ORAL | Status: DC | PRN

## 2019-08-22 MED ORDER — ZINC SULFATE 220 (50 ZN) MG PO CAPS: 220.0000 mg | ORAL_CAPSULE | Freq: Every day | ORAL | Status: DC

## 2019-08-22 MED ORDER — DULOXETINE HCL 60 MG PO CPEP: 60.0000 mg | ORAL_CAPSULE | Freq: Every day | ORAL | Status: DC

## 2019-08-22 MED ORDER — SODIUM CHLORIDE 0.9 % IV BOLUS
1000.0000 mL | Freq: Once | INTRAVENOUS | Status: AC
  Administered 2019-08-22: 11:00:00 1000 mL via INTRAVENOUS

## 2019-08-22 MED ORDER — FENTANYL CITRATE (PF) 100 MCG/2ML IJ SOLN
25.0000 ug | Freq: Once | INTRAMUSCULAR | Status: AC
  Administered 2019-08-22: 25 ug via INTRAVENOUS
  Filled 2019-08-22: qty 2

## 2019-08-22 MED ORDER — TIZANIDINE HCL 4 MG PO TABS: 4.0000 mg | ORAL_TABLET | Freq: Two times a day (BID) | ORAL | Status: DC

## 2019-08-22 MED ORDER — DEXAMETHASONE 4 MG PO TABS: 6.0000 mg | ORAL_TABLET | ORAL | Status: DC

## 2019-08-22 NOTE — ED Notes (Signed)
Pt upset, stating she wants to leave right now. Urged pt to wait to speak to MD. MD notified. Pt leaving AMA.
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
## 2019-08-22 NOTE — ED Notes (Addendum)
Pt attempted to urinate but did not produce enough urine for testing. Will try again as soon as pt feels comfortable doing so.

## 2019-08-22 NOTE — ED Notes (Signed)
Pt taken to CT.
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
## 2019-08-22 NOTE — ED Triage Notes (Signed)
Pt reports cough, fever, body aches, and diarrhea since Monday. Denies any known recent contacts. Had acl reconstruction 6 weeks ago.

## 2019-08-22 NOTE — ED Notes (Signed)
SARS Coronavirus 2 Ag "POSITIVE" reported to [facility].

## 2019-10-27 ENCOUNTER — Other Ambulatory Visit: Payer: Self-pay | Admitting: *Deleted

## 2019-10-27 ENCOUNTER — Encounter: Payer: Self-pay | Admitting: *Deleted

## 2019-10-28 ENCOUNTER — Ambulatory Visit: Payer: Medicaid Other | Admitting: Diagnostic Neuroimaging

## 2019-10-28 ENCOUNTER — Encounter: Payer: Self-pay | Admitting: Diagnostic Neuroimaging

## 2019-10-28 ENCOUNTER — Telehealth: Payer: Self-pay | Admitting: *Deleted

## 2019-10-28 NOTE — Telephone Encounter (Signed)
Patient was no show for new patient appointment today. 

## 2020-09-29 IMAGING — CT CT HEAD W/O CM
3 series · 15 of 47 positions shown, 18 images · non-contrast
Comparison: None.

CLINICAL DATA: Syncope, weakness, COVID positive

EXAM:
CT HEAD WITHOUT CONTRAST
TECHNIQUE: Contiguous axial images were obtained from the base of the skull
through the vertex without intravenous contrast.

[Series 3: head 5.0 h30s · axial · 0.43mm/px · z∈[-35,+95]mm · 9 of 32 slices shown, 12 images]
[im 3/32  brain]
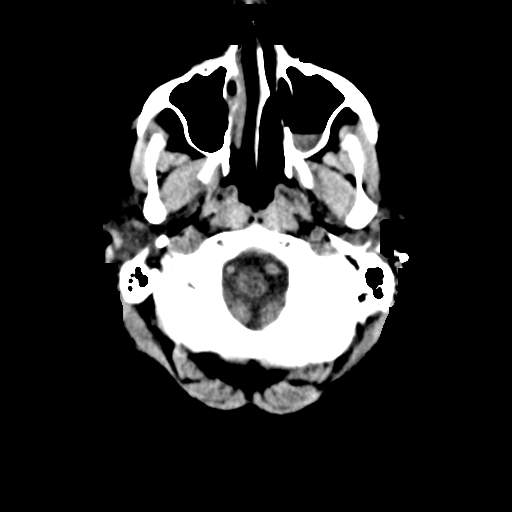
[im 3/32  bone]
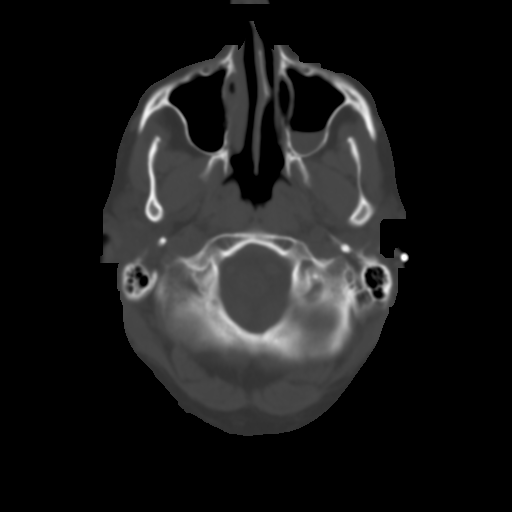
[im 6/32  brain]
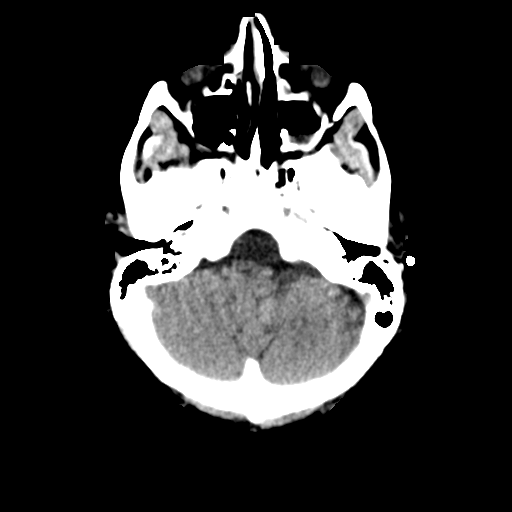
[im 9/32  brain]
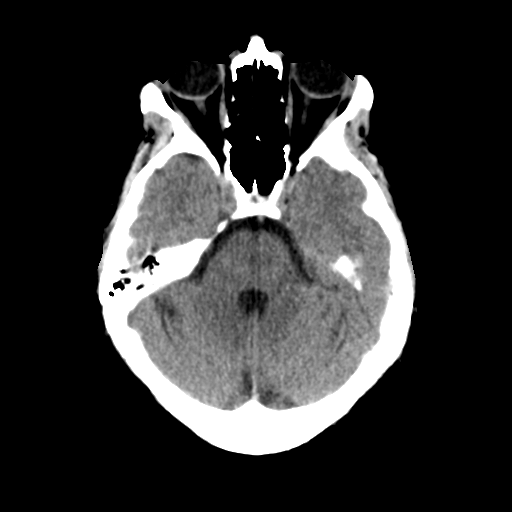
[im 12/32  brain]
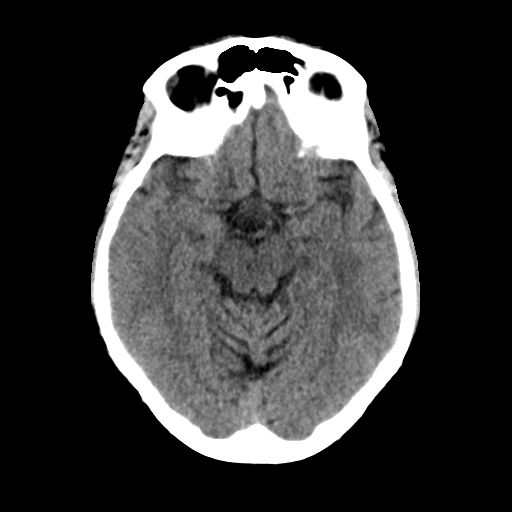
[im 17/32  brain]
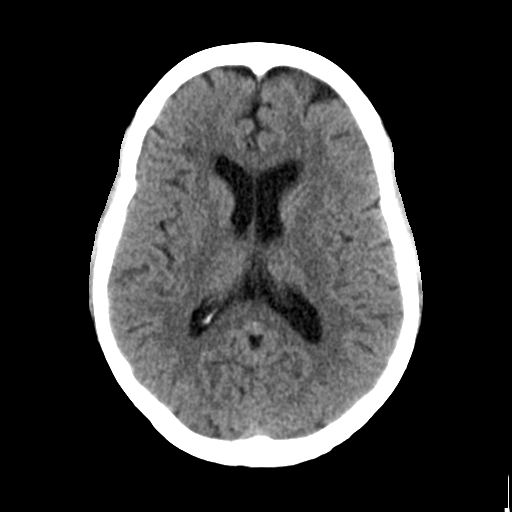
[im 17/32  bone]
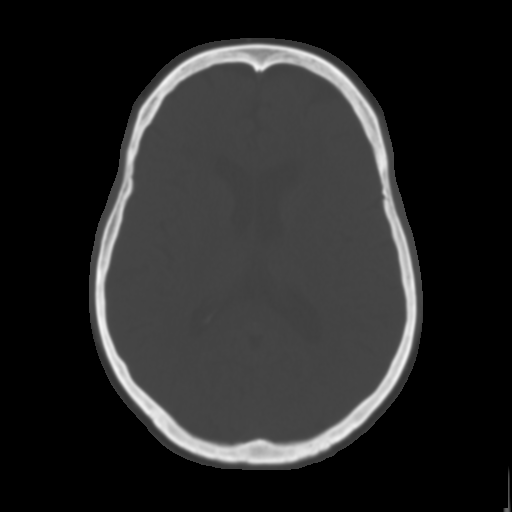
[im 20/32  brain]
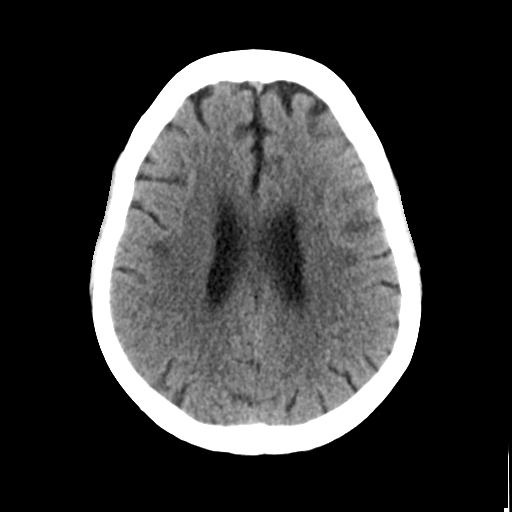
[im 23/32  brain]
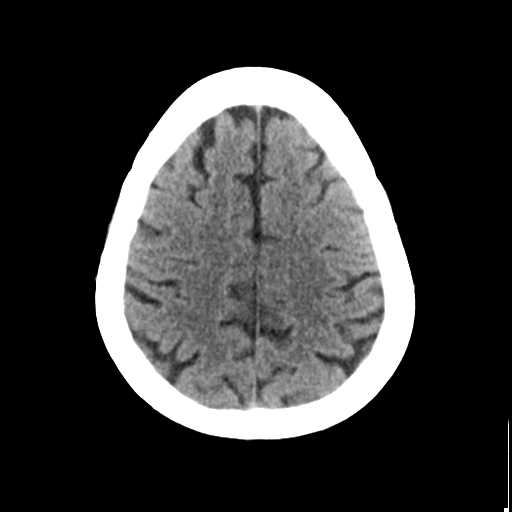
[im 26/32  brain]
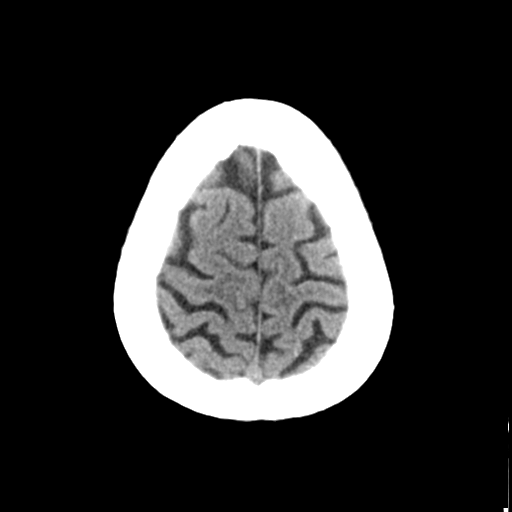
[im 29/32  brain]
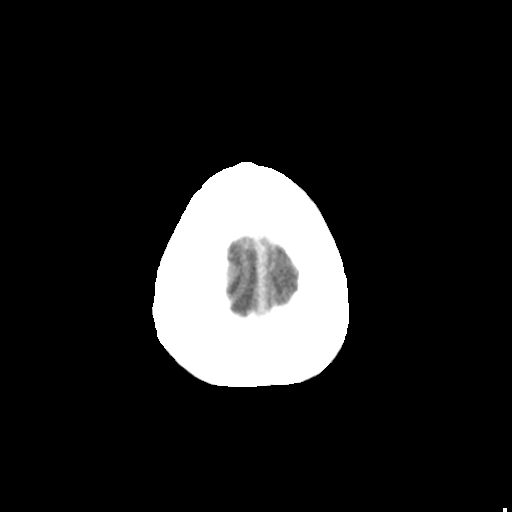
[im 29/32  bone]
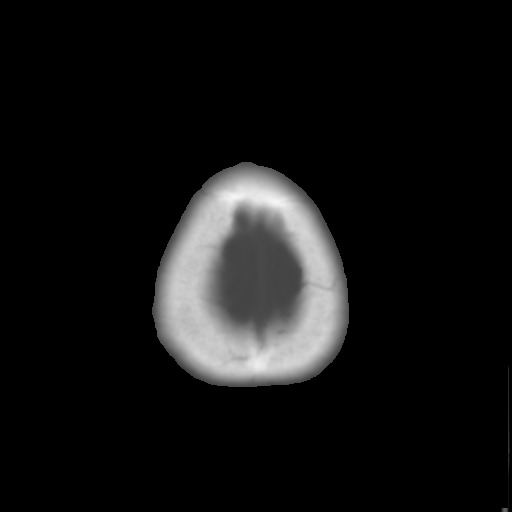

[Series 5: head 3.0 mpr cor · coronal · 0.35mm/px · 3 of 66 slices shown]
[im 22/66  brain]
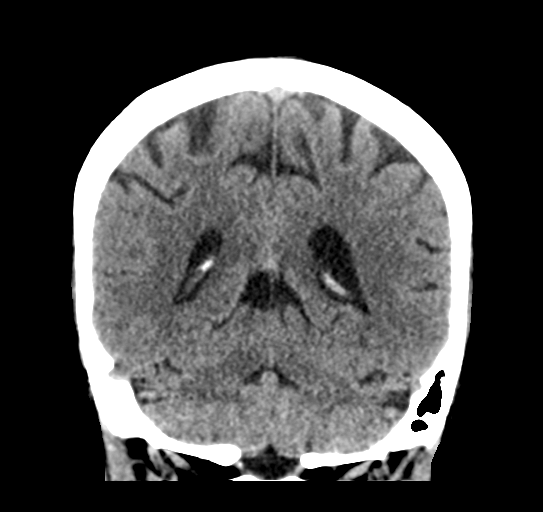
[im 29/66  brain]
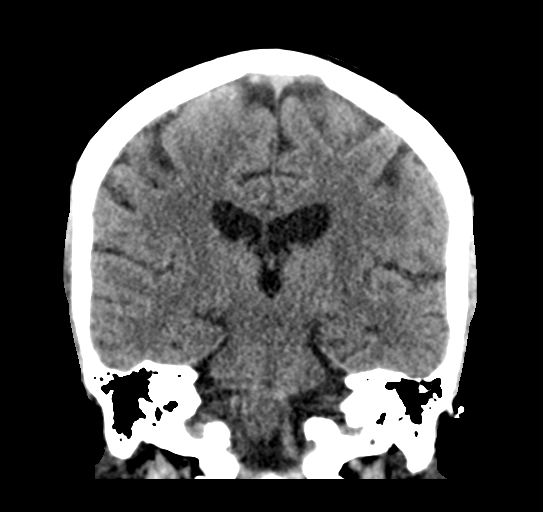
[im 37/66  brain]
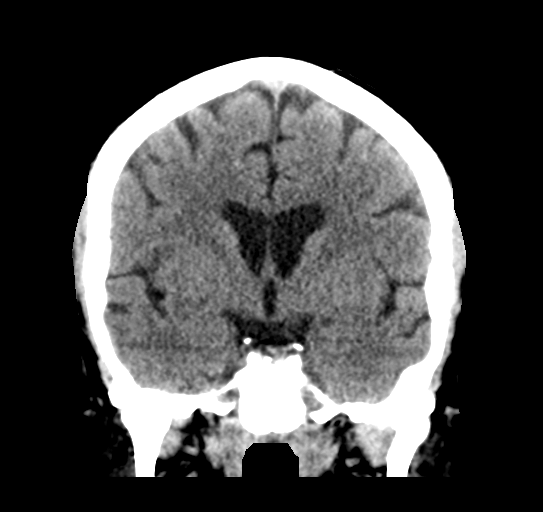

[Series 6: head 3.0 mpr sag · sagittal · 0.34mm/px · 3 of 55 slices shown]
[im 19/55  brain]
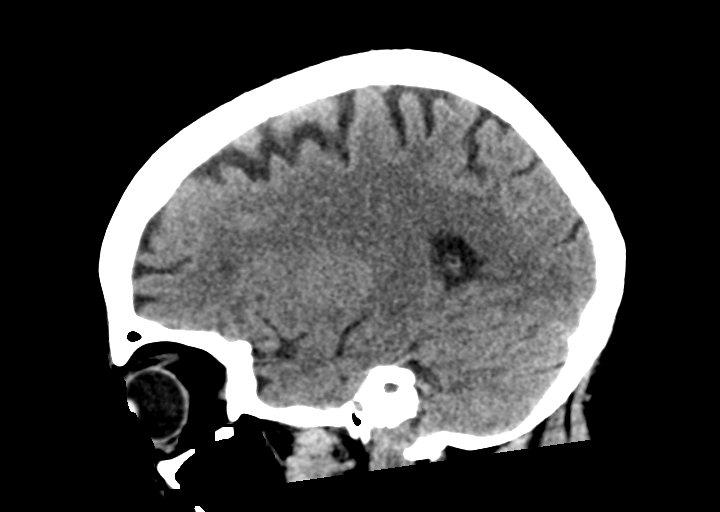
[im 28/55  brain]
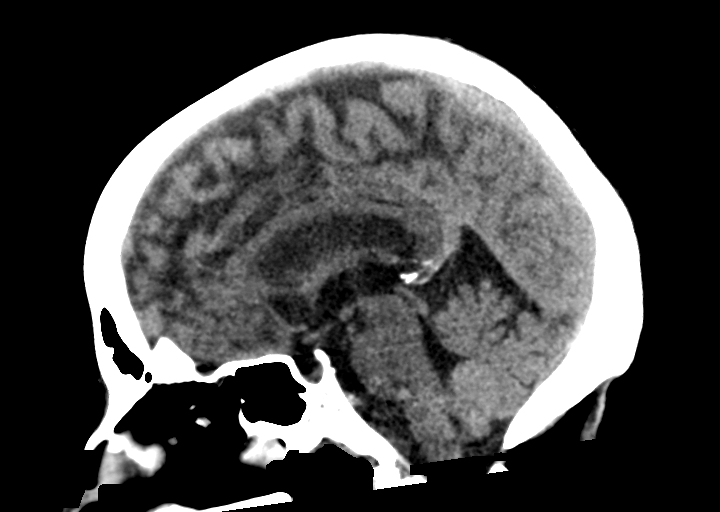
[im 37/55  brain]
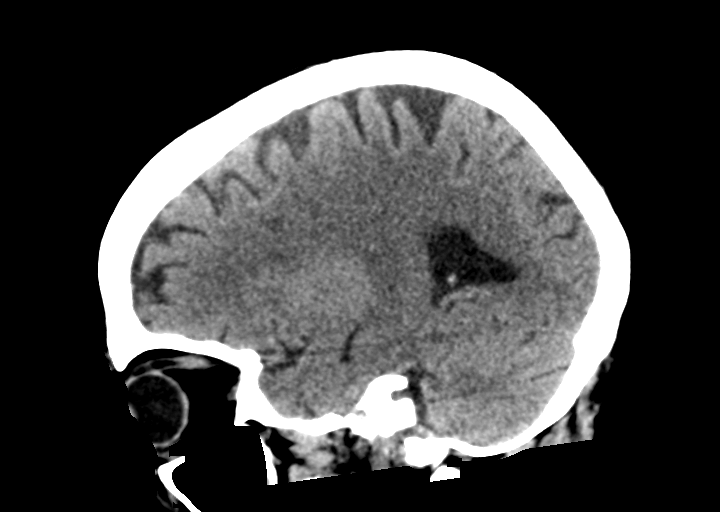

[15 of 47 positions shown; findings below may reference images not displayed]

FINDINGS: Brain: No evidence of acute infarction, hemorrhage, hydrocephalus,
extra-axial collection or mass lesion/mass effect. Periventricular
and deep white matter hypodensity, with somewhat more focal areas in
the subcortical frontal lobes (series 3, image 20).

Vascular: No hyperdense vessel or unexpected calcification.

Skull: Normal. Negative for fracture or focal lesion.

Sinuses/Orbits: No acute finding.

Other: None.
IMPRESSION: 1.  No acute intracranial pathology.

2. Periventricular and deep white matter hypodensity, with somewhat
more focal areas of hypodensity in the subcortical frontal lobes.
These findings are most likely reflect hypertensive small-vessel
white matter disease although given patient age and distribution,
demyelination or perhaps even small metastases are not strictly
excluded. Contrast enhanced MRI may be used to further evaluate if
desired.

## 2020-09-29 IMAGING — DX DG KNEE COMPLETE 4+V*L*
1 series · 4 of 4 positions shown · non-contrast
Comparison: 06/24/2017

CLINICAL DATA: Left knee pain after a fall. Recent ACL repair on
07/14/2019

EXAM:
LEFT KNEE - COMPLETE 4+ VIEW

[Series 1: knee · 0.14mm/px · 4 of 4 slices shown]
[im 1/4]
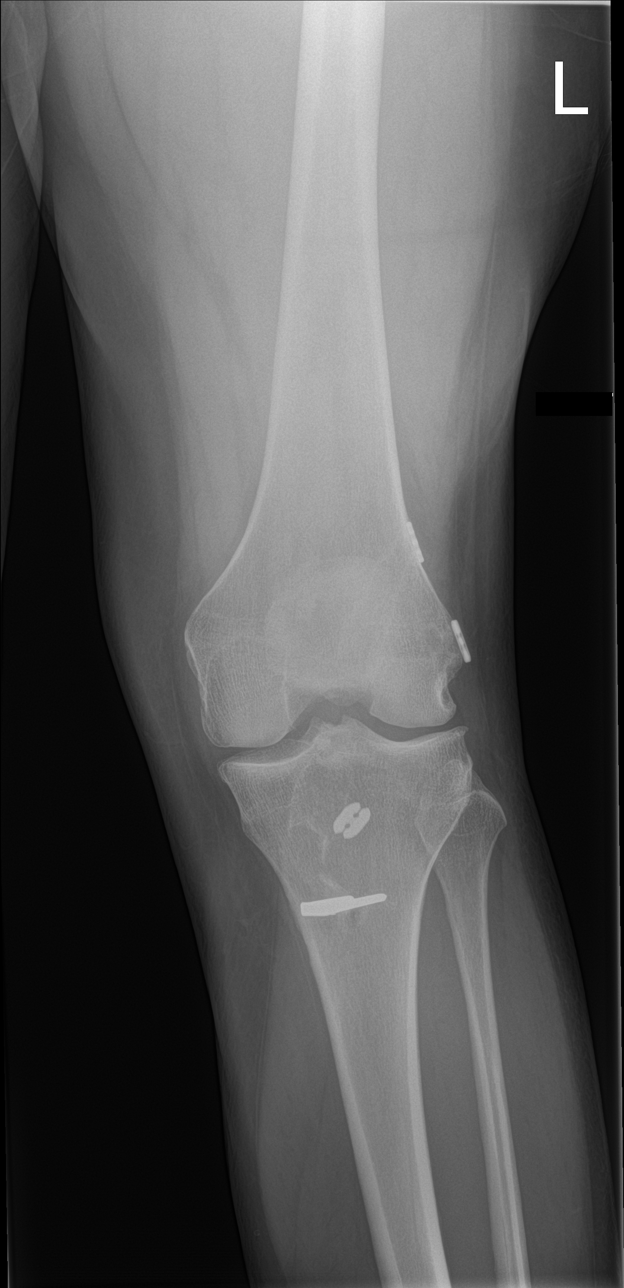
[im 2/4]
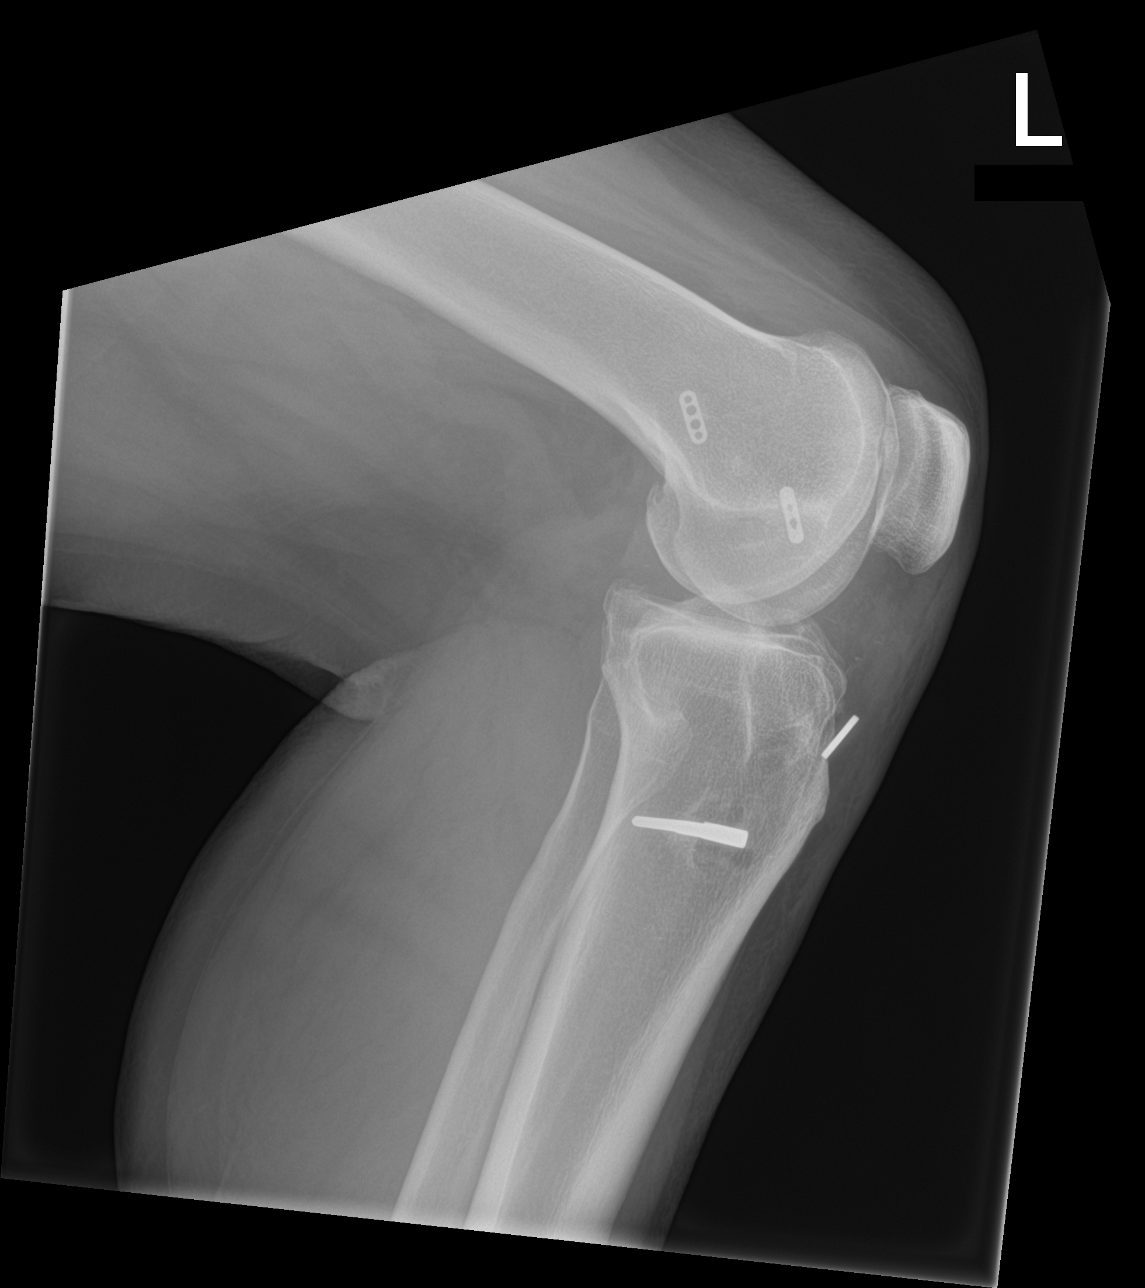
[im 3/4]
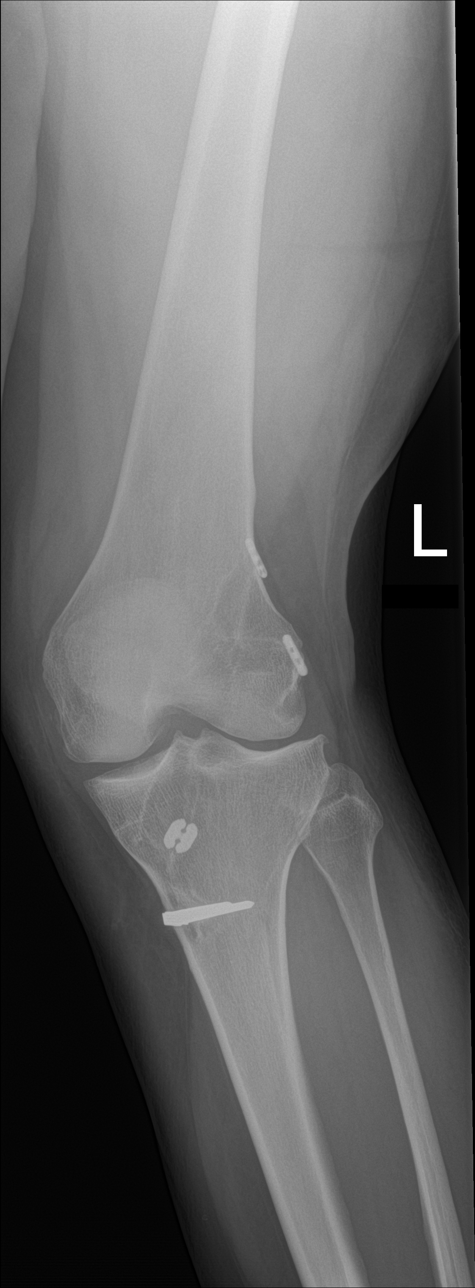
[im 4/4]
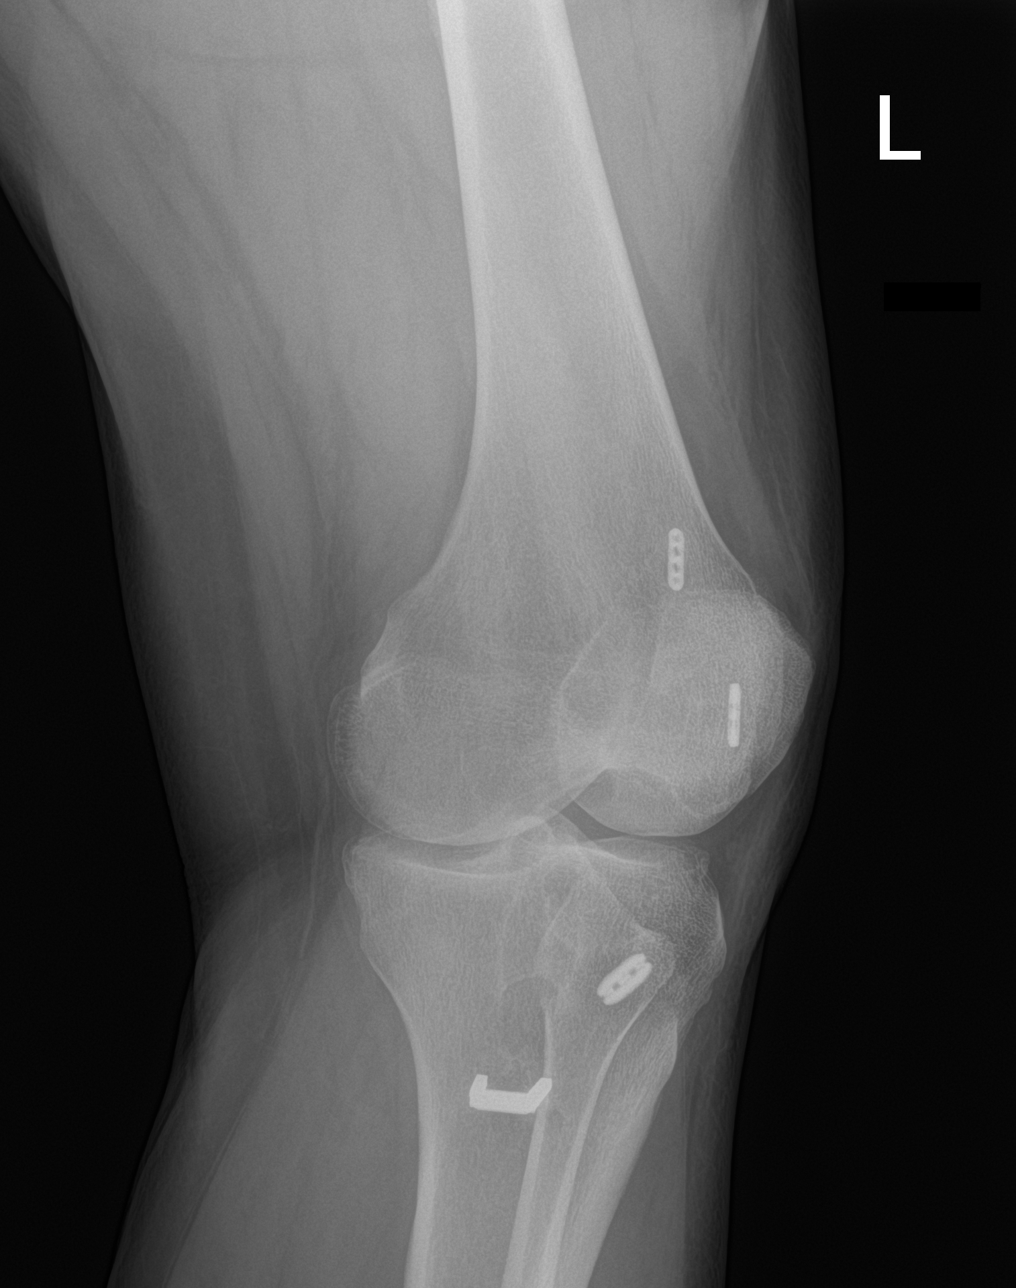

[4 of 4 positions shown; findings below may reference images not displayed]

FINDINGS: Four views study shows no acute fracture. No subluxation or
dislocation. Surgical hardware compatible with reported clinical
history of ACL repair with evidence of revision since 06/24/2017.
There is some loss of joint space with degenerative spurring in the
lateral compartment. No substantial joint effusion.
IMPRESSION: Postsurgical changes without evidence of acute bony abnormality or
joint effusion.

## 2021-04-17 DIAGNOSIS — M25531 Pain in right wrist: Secondary | ICD-10-CM | POA: Diagnosis not present

## 2021-04-17 DIAGNOSIS — M25511 Pain in right shoulder: Secondary | ICD-10-CM | POA: Diagnosis not present

## 2021-04-19 DIAGNOSIS — N189 Chronic kidney disease, unspecified: Secondary | ICD-10-CM | POA: Diagnosis not present

## 2021-04-24 DIAGNOSIS — N189 Chronic kidney disease, unspecified: Secondary | ICD-10-CM | POA: Diagnosis not present

## 2021-04-24 DIAGNOSIS — K219 Gastro-esophageal reflux disease without esophagitis: Secondary | ICD-10-CM | POA: Diagnosis not present

## 2021-04-24 DIAGNOSIS — G5602 Carpal tunnel syndrome, left upper limb: Secondary | ICD-10-CM | POA: Diagnosis not present

## 2021-04-24 DIAGNOSIS — G43909 Migraine, unspecified, not intractable, without status migrainosus: Secondary | ICD-10-CM | POA: Diagnosis not present

## 2021-04-24 DIAGNOSIS — E785 Hyperlipidemia, unspecified: Secondary | ICD-10-CM | POA: Diagnosis not present

## 2021-04-24 DIAGNOSIS — R69 Illness, unspecified: Secondary | ICD-10-CM | POA: Diagnosis not present

## 2021-04-24 DIAGNOSIS — G252 Other specified forms of tremor: Secondary | ICD-10-CM | POA: Diagnosis not present

## 2021-04-24 DIAGNOSIS — I10 Essential (primary) hypertension: Secondary | ICD-10-CM | POA: Diagnosis not present

## 2021-04-24 DIAGNOSIS — M5030 Other cervical disc degeneration, unspecified cervical region: Secondary | ICD-10-CM | POA: Diagnosis not present

## 2021-05-16 DIAGNOSIS — K219 Gastro-esophageal reflux disease without esophagitis: Secondary | ICD-10-CM | POA: Diagnosis not present

## 2021-05-16 DIAGNOSIS — I10 Essential (primary) hypertension: Secondary | ICD-10-CM | POA: Diagnosis not present

## 2021-05-16 DIAGNOSIS — G252 Other specified forms of tremor: Secondary | ICD-10-CM | POA: Diagnosis not present

## 2021-05-16 DIAGNOSIS — G5602 Carpal tunnel syndrome, left upper limb: Secondary | ICD-10-CM | POA: Diagnosis not present

## 2021-05-16 DIAGNOSIS — R69 Illness, unspecified: Secondary | ICD-10-CM | POA: Diagnosis not present

## 2021-05-16 DIAGNOSIS — M5030 Other cervical disc degeneration, unspecified cervical region: Secondary | ICD-10-CM | POA: Diagnosis not present

## 2021-05-16 DIAGNOSIS — E785 Hyperlipidemia, unspecified: Secondary | ICD-10-CM | POA: Diagnosis not present

## 2021-05-16 DIAGNOSIS — G43909 Migraine, unspecified, not intractable, without status migrainosus: Secondary | ICD-10-CM | POA: Diagnosis not present

## 2021-05-16 DIAGNOSIS — Z1331 Encounter for screening for depression: Secondary | ICD-10-CM | POA: Diagnosis not present

## 2021-05-16 DIAGNOSIS — F418 Other specified anxiety disorders: Secondary | ICD-10-CM | POA: Diagnosis not present

## 2021-05-16 DIAGNOSIS — N189 Chronic kidney disease, unspecified: Secondary | ICD-10-CM | POA: Diagnosis not present

## 2021-06-20 DIAGNOSIS — J069 Acute upper respiratory infection, unspecified: Secondary | ICD-10-CM | POA: Diagnosis not present

## 2021-07-08 DIAGNOSIS — J189 Pneumonia, unspecified organism: Secondary | ICD-10-CM | POA: Diagnosis not present

## 2021-07-08 DIAGNOSIS — R062 Wheezing: Secondary | ICD-10-CM | POA: Diagnosis not present

## 2021-07-08 DIAGNOSIS — K2100 Gastro-esophageal reflux disease with esophagitis, without bleeding: Secondary | ICD-10-CM | POA: Diagnosis not present

## 2021-07-08 DIAGNOSIS — R051 Acute cough: Secondary | ICD-10-CM | POA: Diagnosis not present

## 2021-07-08 DIAGNOSIS — J040 Acute laryngitis: Secondary | ICD-10-CM | POA: Diagnosis not present

## 2021-07-08 DIAGNOSIS — R0602 Shortness of breath: Secondary | ICD-10-CM | POA: Diagnosis not present

## 2021-07-08 DIAGNOSIS — I10 Essential (primary) hypertension: Secondary | ICD-10-CM | POA: Diagnosis not present

## 2021-09-20 DIAGNOSIS — K219 Gastro-esophageal reflux disease without esophagitis: Secondary | ICD-10-CM | POA: Diagnosis not present

## 2021-09-20 DIAGNOSIS — E785 Hyperlipidemia, unspecified: Secondary | ICD-10-CM | POA: Diagnosis not present

## 2021-09-20 DIAGNOSIS — G5602 Carpal tunnel syndrome, left upper limb: Secondary | ICD-10-CM | POA: Diagnosis not present

## 2021-09-20 DIAGNOSIS — R69 Illness, unspecified: Secondary | ICD-10-CM | POA: Diagnosis not present

## 2021-09-20 DIAGNOSIS — G252 Other specified forms of tremor: Secondary | ICD-10-CM | POA: Diagnosis not present

## 2021-09-20 DIAGNOSIS — G43909 Migraine, unspecified, not intractable, without status migrainosus: Secondary | ICD-10-CM | POA: Diagnosis not present

## 2021-09-20 DIAGNOSIS — N189 Chronic kidney disease, unspecified: Secondary | ICD-10-CM | POA: Diagnosis not present

## 2021-09-20 DIAGNOSIS — M5030 Other cervical disc degeneration, unspecified cervical region: Secondary | ICD-10-CM | POA: Diagnosis not present

## 2021-09-20 DIAGNOSIS — I10 Essential (primary) hypertension: Secondary | ICD-10-CM | POA: Diagnosis not present

## 2022-01-04 DIAGNOSIS — E785 Hyperlipidemia, unspecified: Secondary | ICD-10-CM | POA: Diagnosis not present

## 2022-01-04 DIAGNOSIS — N189 Chronic kidney disease, unspecified: Secondary | ICD-10-CM | POA: Diagnosis not present

## 2022-01-08 DIAGNOSIS — N951 Menopausal and female climacteric states: Secondary | ICD-10-CM | POA: Diagnosis not present

## 2022-01-08 DIAGNOSIS — K219 Gastro-esophageal reflux disease without esophagitis: Secondary | ICD-10-CM | POA: Diagnosis not present

## 2022-01-08 DIAGNOSIS — E663 Overweight: Secondary | ICD-10-CM | POA: Diagnosis not present

## 2022-01-08 DIAGNOSIS — N189 Chronic kidney disease, unspecified: Secondary | ICD-10-CM | POA: Diagnosis not present

## 2022-01-08 DIAGNOSIS — R69 Illness, unspecified: Secondary | ICD-10-CM | POA: Diagnosis not present

## 2022-01-08 DIAGNOSIS — G252 Other specified forms of tremor: Secondary | ICD-10-CM | POA: Diagnosis not present

## 2022-01-08 DIAGNOSIS — I10 Essential (primary) hypertension: Secondary | ICD-10-CM | POA: Diagnosis not present

## 2022-01-08 DIAGNOSIS — E785 Hyperlipidemia, unspecified: Secondary | ICD-10-CM | POA: Diagnosis not present

## 2022-01-08 DIAGNOSIS — G43909 Migraine, unspecified, not intractable, without status migrainosus: Secondary | ICD-10-CM | POA: Diagnosis not present

## 2022-01-08 DIAGNOSIS — G5602 Carpal tunnel syndrome, left upper limb: Secondary | ICD-10-CM | POA: Diagnosis not present

## 2022-01-08 DIAGNOSIS — M5030 Other cervical disc degeneration, unspecified cervical region: Secondary | ICD-10-CM | POA: Diagnosis not present

## 2022-02-19 DIAGNOSIS — G252 Other specified forms of tremor: Secondary | ICD-10-CM | POA: Diagnosis not present

## 2022-02-19 DIAGNOSIS — E785 Hyperlipidemia, unspecified: Secondary | ICD-10-CM | POA: Diagnosis not present

## 2022-02-19 DIAGNOSIS — G43909 Migraine, unspecified, not intractable, without status migrainosus: Secondary | ICD-10-CM | POA: Diagnosis not present

## 2022-02-19 DIAGNOSIS — I10 Essential (primary) hypertension: Secondary | ICD-10-CM | POA: Diagnosis not present

## 2022-02-19 DIAGNOSIS — M5030 Other cervical disc degeneration, unspecified cervical region: Secondary | ICD-10-CM | POA: Diagnosis not present

## 2022-02-19 DIAGNOSIS — R69 Illness, unspecified: Secondary | ICD-10-CM | POA: Diagnosis not present

## 2022-02-19 DIAGNOSIS — K219 Gastro-esophageal reflux disease without esophagitis: Secondary | ICD-10-CM | POA: Diagnosis not present

## 2022-02-19 DIAGNOSIS — E663 Overweight: Secondary | ICD-10-CM | POA: Diagnosis not present

## 2022-02-19 DIAGNOSIS — N183 Chronic kidney disease, stage 3 unspecified: Secondary | ICD-10-CM | POA: Diagnosis not present

## 2022-02-19 DIAGNOSIS — G5602 Carpal tunnel syndrome, left upper limb: Secondary | ICD-10-CM | POA: Diagnosis not present

## 2022-02-19 DIAGNOSIS — N951 Menopausal and female climacteric states: Secondary | ICD-10-CM | POA: Diagnosis not present

## 2022-07-29 ENCOUNTER — Other Ambulatory Visit: Payer: Self-pay | Admitting: Internal Medicine

## 2022-07-30 ENCOUNTER — Other Ambulatory Visit: Payer: Self-pay | Admitting: Internal Medicine

## 2022-08-04 ENCOUNTER — Other Ambulatory Visit: Payer: Self-pay | Admitting: Internal Medicine

## 2022-08-06 ENCOUNTER — Other Ambulatory Visit: Payer: Self-pay | Admitting: Internal Medicine

## 2022-09-03 ENCOUNTER — Other Ambulatory Visit: Payer: Self-pay | Admitting: Internal Medicine

## 2022-09-04 ENCOUNTER — Other Ambulatory Visit: Payer: Self-pay

## 2022-09-04 NOTE — Telephone Encounter (Signed)
Patient needs appt

## 2022-11-02 ENCOUNTER — Other Ambulatory Visit: Payer: Self-pay | Admitting: Internal Medicine

## 2022-11-10 ENCOUNTER — Other Ambulatory Visit: Payer: Self-pay | Admitting: Internal Medicine

## 2022-12-10 ENCOUNTER — Other Ambulatory Visit: Payer: Self-pay | Admitting: Internal Medicine

## 2022-12-24 ENCOUNTER — Ambulatory Visit: Payer: Medicare HMO | Admitting: Internal Medicine

## 2022-12-25 ENCOUNTER — Ambulatory Visit (INDEPENDENT_AMBULATORY_CARE_PROVIDER_SITE_OTHER): Payer: Medicare HMO | Admitting: Internal Medicine

## 2022-12-25 VITALS — BP 130/90 | HR 80 | Ht 65.0 in | Wt 162.6 lb

## 2022-12-25 DIAGNOSIS — K4090 Unilateral inguinal hernia, without obstruction or gangrene, not specified as recurrent: Secondary | ICD-10-CM | POA: Diagnosis not present

## 2022-12-25 DIAGNOSIS — I10 Essential (primary) hypertension: Secondary | ICD-10-CM

## 2022-12-25 DIAGNOSIS — K219 Gastro-esophageal reflux disease without esophagitis: Secondary | ICD-10-CM

## 2022-12-25 DIAGNOSIS — E782 Mixed hyperlipidemia: Secondary | ICD-10-CM

## 2022-12-25 MED ORDER — ATORVASTATIN CALCIUM 10 MG PO TABS: 10.0000 mg | ORAL_TABLET | Freq: Every day | ORAL | 0 refills | Status: DC

## 2022-12-25 MED ORDER — OLMESARTAN MEDOXOMIL 40 MG PO TABS: 40.0000 mg | ORAL_TABLET | Freq: Every morning | ORAL | 0 refills | Status: DC

## 2022-12-25 MED ORDER — GABAPENTIN 400 MG PO CAPS: 400.0000 mg | ORAL_CAPSULE | Freq: Three times a day (TID) | ORAL | 0 refills | Status: DC

## 2022-12-25 MED ORDER — TIZANIDINE HCL 4 MG PO TABS: 4.0000 mg | ORAL_TABLET | Freq: Two times a day (BID) | ORAL | 2 refills | Status: DC

## 2022-12-25 MED ORDER — AMLODIPINE BESYLATE 10 MG PO TABS: 10.0000 mg | ORAL_TABLET | Freq: Every morning | ORAL | 0 refills | Status: DC

## 2022-12-25 MED ORDER — PANTOPRAZOLE SODIUM 40 MG PO TBEC: 40.0000 mg | DELAYED_RELEASE_TABLET | Freq: Every morning | ORAL | 0 refills | Status: DC
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
## 2022-12-31 ENCOUNTER — Other Ambulatory Visit: Payer: Self-pay | Admitting: Internal Medicine

## 2022-12-31 ENCOUNTER — Encounter: Payer: Self-pay | Admitting: Internal Medicine

## 2022-12-31 DIAGNOSIS — G43E09 Chronic migraine with aura, not intractable, without status migrainosus: Secondary | ICD-10-CM

## 2022-12-31 DIAGNOSIS — F329 Major depressive disorder, single episode, unspecified: Secondary | ICD-10-CM

## 2022-12-31 MED ORDER — RIZATRIPTAN BENZOATE 5 MG PO TBDP: ORAL_TABLET | ORAL | 2 refills | Status: DC

## 2022-12-31 MED ORDER — DULOXETINE HCL 60 MG PO CPEP: 60.0000 mg | ORAL_CAPSULE | Freq: Every morning | ORAL | 0 refills | Status: DC

## 2022-12-31 NOTE — Addendum Note (Signed)
Addended by: Aundria Mems AHMAD on: 12/31/2022 04:48 PM   Modules accepted: Orders

## 2023-01-03 ENCOUNTER — Ambulatory Visit: Payer: Self-pay | Admitting: Surgery

## 2023-01-03 DIAGNOSIS — K4090 Unilateral inguinal hernia, without obstruction or gangrene, not specified as recurrent: Secondary | ICD-10-CM | POA: Diagnosis not present

## 2023-01-04 ENCOUNTER — Encounter (HOSPITAL_COMMUNITY): Payer: Self-pay | Admitting: Surgery

## 2023-01-04 ENCOUNTER — Other Ambulatory Visit: Payer: Self-pay

## 2023-01-04 NOTE — Progress Notes (Addendum)
For Anesthesia: PCP - Sherron Monday, MD  Cardiologist - N/A  Chest x-ray - greater than 1 year in [facility] EKG - greater than 1 year in [facility] Stress Test - greater than 2 years in [facility] ECHO - N/A Cardiac Cath - N/A Pacemaker/ICD device last checked: N/A Pacemaker orders received: N/A Device Rep notified: N/A  Spinal Cord Stimulator: N/A  Sleep Study - N/A CPAP - N/A  Fasting Blood Sugar - N/A Checks Blood Sugar __N/A___ times a day Date and result of last Hgb A1c-N/A  Last dose of GLP1 agonist- N/A GLP1 instructions: N/A  Last dose of SGLT-2 inhibitors- N/A SGLT-2 instructions:N/A  Blood Thinner Instructions: N/A Aspirin Instructions:N/A Last Dose:N/A  Activity level: Can go up a flight of stairs and activities of daily living without stopping and without chest pain and/or shortness of breath    Anesthesia review: N/A  Patient denies shortness of breath, fever, cough and chest pain during pre op phone call.   Patient verbalized understanding of instructions reviewed via telephone.

## 2023-01-04 NOTE — Progress Notes (Signed)
Attempted to obtain medical history via telephone, unable to reach at this time. HIPAA compliant voicemail message left requesting return call to pre surgical testing department. 

## 2023-01-06 ENCOUNTER — Encounter: Payer: Self-pay | Admitting: Internal Medicine

## 2023-01-06 ENCOUNTER — Encounter (HOSPITAL_COMMUNITY): Payer: Self-pay | Admitting: Surgery

## 2023-01-06 DIAGNOSIS — K4090 Unilateral inguinal hernia, without obstruction or gangrene, not specified as recurrent: Secondary | ICD-10-CM

## 2023-01-06 NOTE — H&P (Signed)
REFERRING PHYSICIAN: Bluford Main, MD  PROVIDER: Lynton Crescenzo Myra Rude, MD   Chief Complaint: New Consultation and Inguinal Hernia  History of Present Illness:  Patient is referred by Dr. Ranee Gosselin for surgical evaluation and management of a newly diagnosed right inguinal hernia. Patient's primary care physician is Dr. Ellsworth Lennox. Patient first noted a small bulge in the right groin a few years ago. This has gradually increased in size. It is not completely reducible. Patient has noted some mild constipation but no episodes of bowel obstruction. She has undergone a previous abdominoplasty. She has had occasional mild discomfort in the right groin. She has had no prior hernia repairs. She presents today to discuss right inguinal hernia repair.  Review of Systems: A complete review of systems was obtained from the patient. I have reviewed this information and discussed as appropriate with the patient. See HPI as well for other ROS.  Review of Systems  Constitutional: Negative.  HENT: Negative.  Eyes: Negative.  Respiratory: Negative.  Cardiovascular: Negative.  Gastrointestinal: Negative.  Genitourinary: Negative.  Musculoskeletal: Negative.  Skin: Negative.  Neurological: Negative.  Endo/Heme/Allergies: Negative.  Psychiatric/Behavioral: Negative.    Medical History: Past Medical History:  Diagnosis Date  Anxiety  Arthritis  DVT (deep venous thrombosis) (CMS/HHS-HCC)  GERD (gastroesophageal reflux disease)  Hyperlipidemia  Hypertension  Substance abuse (CMS/HHS-HCC)   Patient Active Problem List  Diagnosis  Right inguinal hernia   Past Surgical History:  Procedure Laterality Date  ABDOMINOPLASTY  ARTHROSCOPIC REPAIR ACL Left  ENDOSCOPIC CARPAL TUNNEL RELEASE  ESSURE TUBAL LIGATION  KNEE ARTHROSCOPY Bilateral    Allergies  Allergen Reactions  Adhesive Rash  Prefers paper tape  Penicillins Rash   Current Outpatient Medications on File Prior to  Visit  Medication Sig Dispense Refill  amLODIPine (NORVASC) 10 MG tablet Take 10 mg by mouth every morning  atorvastatin (LIPITOR) 10 MG tablet Take 10 mg by mouth at bedtime  chlorthalidone 25 MG tablet  DULoxetine (CYMBALTA) 60 MG DR capsule Take 60 mg by mouth every morning  gabapentin (NEURONTIN) 400 MG capsule Take 400 mg by mouth 3 (three) times daily  olmesartan (BENICAR) 40 MG tablet Take 40 mg by mouth every morning  pantoprazole (PROTONIX) 40 MG DR tablet Take 40 mg by mouth every morning  rizatriptan (MAXALT-MLT) 5 MG disintegrating tablet DISSOLVE 1 TABLET ON THE TONGUE EVERY 2 HOURS AS NEEDED. MAXIMUM DAILY DOSE IS 30 MG PER 24 HOURS  tiZANidine (ZANAFLEX) 4 MG tablet Take 4 mg by mouth 2 (two) times daily  lisinopriL (ZESTRIL) 40 MG tablet Take 40 mg by mouth once daily   No current facility-administered medications on file prior to visit.   Family History  Problem Relation Age of Onset  Skin cancer Mother  Cancer Father    Social History   Tobacco Use  Smoking Status Never  Smokeless Tobacco Never    Social History   Socioeconomic History  Marital status: Single  Tobacco Use  Smoking status: Never  Smokeless tobacco: Never  Substance and Sexual Activity  Alcohol use: Not Currently  Drug use: Not Currently  Types: Cocaine   Objective:   Vitals:  BP: 128/85  Pulse: 73  Temp: 37.2 C (99 F)  SpO2: 97%  Weight: 73 kg (161 lb)  Height: 165.1 cm (5\' 5" )   Body mass index is 26.79 kg/m.  Physical Exam   GENERAL APPEARANCE Comfortable, no acute issues Development: normal Gross deformities: none  SKIN Rash, lesions, ulcers: none Induration, erythema: none  Nodules: none palpable  EYES Conjunctiva and lids: normal Pupils: equal and reactive  EARS, NOSE, MOUTH, THROAT External ears: no lesion or deformity External nose: no lesion or deformity Hearing: grossly normal  NECK Symmetric: yes Trachea: midline Thyroid: no palpable nodules in  the thyroid bed  ABDOMEN Well-healed surgical incisions consistent with abdominoplasty. No sign of umbilical hernia.  GENITOURINARY/RECTAL There is an obvious bulge in the right groin. On palpation this is slightly firm. It is partially reducible and mildly tender to deep palpation. There is no evidence of hernia on the left side.  MUSCULOSKELETAL Station and gait: normal Digits and nails: no clubbing or cyanosis Muscle strength: grossly normal all extremities Range of motion: grossly normal all extremities Deformity: none  LYMPHATIC Cervical: none palpable Supraclavicular: none palpable  PSYCHIATRIC Oriented to person, place, and time: yes Mood and affect: normal for situation Judgment and insight: appropriate for situation   Assessment and Plan:   Right inguinal hernia  Patient is referred by her primary care physician and her orthopedic surgeon for surgical evaluation and management of a right inguinal hernia. Patient is provided with written literature on hernia surgery to review at home.  Patient has a moderate-sized right inguinal hernia. It is only partially reducible. It is mildly symptomatic. I have recommended operative repair. Today we discussed doing open repair using a mesh patch. We discussed the use of mesh. We discussed restrictions on her activities after the surgery. We discussed the risk of recurrence. The patient understands and wishes to proceed with surgery in the near future.  Darnell Level, MD [facility] practice Office: [phone]

## 2023-01-07 ENCOUNTER — Ambulatory Visit (HOSPITAL_COMMUNITY): Payer: Medicare HMO | Admitting: Certified Registered Nurse Anesthetist

## 2023-01-07 ENCOUNTER — Other Ambulatory Visit: Payer: Self-pay | Admitting: Internal Medicine

## 2023-01-07 ENCOUNTER — Ambulatory Visit (HOSPITAL_COMMUNITY)
Admission: RE | Admit: 2023-01-07 | Discharge: 2023-01-07 | Disposition: A | Discharge status: Home / Self Care | Payer: Medicare HMO | Attending: Surgery | Admitting: Surgery

## 2023-01-07 ENCOUNTER — Other Ambulatory Visit: Payer: Self-pay

## 2023-01-07 ENCOUNTER — Encounter (HOSPITAL_COMMUNITY): Payer: Self-pay | Admitting: Surgery

## 2023-01-07 ENCOUNTER — Encounter (HOSPITAL_COMMUNITY)
Admission: RE | Disposition: A | Discharge status: Home / Self Care | Payer: Self-pay | Source: Home / Self Care | Attending: Surgery

## 2023-01-07 ENCOUNTER — Ambulatory Visit (HOSPITAL_BASED_OUTPATIENT_CLINIC_OR_DEPARTMENT_OTHER): Payer: Medicare HMO | Admitting: Certified Registered Nurse Anesthetist

## 2023-01-07 DIAGNOSIS — N289 Disorder of kidney and ureter, unspecified: Secondary | ICD-10-CM | POA: Insufficient documentation

## 2023-01-07 DIAGNOSIS — K219 Gastro-esophageal reflux disease without esophagitis: Secondary | ICD-10-CM | POA: Diagnosis not present

## 2023-01-07 DIAGNOSIS — K4090 Unilateral inguinal hernia, without obstruction or gangrene, not specified as recurrent: Secondary | ICD-10-CM

## 2023-01-07 DIAGNOSIS — Z86718 Personal history of other venous thrombosis and embolism: Secondary | ICD-10-CM | POA: Insufficient documentation

## 2023-01-07 DIAGNOSIS — M1990 Unspecified osteoarthritis, unspecified site: Secondary | ICD-10-CM | POA: Insufficient documentation

## 2023-01-07 DIAGNOSIS — I10 Essential (primary) hypertension: Secondary | ICD-10-CM | POA: Insufficient documentation

## 2023-01-07 DIAGNOSIS — E782 Mixed hyperlipidemia: Secondary | ICD-10-CM | POA: Diagnosis not present

## 2023-01-07 DIAGNOSIS — G43E09 Chronic migraine with aura, not intractable, without status migrainosus: Secondary | ICD-10-CM

## 2023-01-07 DIAGNOSIS — Z79899 Other long term (current) drug therapy: Secondary | ICD-10-CM | POA: Diagnosis not present

## 2023-01-07 DIAGNOSIS — I129 Hypertensive chronic kidney disease with stage 1 through stage 4 chronic kidney disease, or unspecified chronic kidney disease: Secondary | ICD-10-CM | POA: Diagnosis not present

## 2023-01-07 DIAGNOSIS — K5900 Constipation, unspecified: Secondary | ICD-10-CM | POA: Diagnosis not present

## 2023-01-07 DIAGNOSIS — N182 Chronic kidney disease, stage 2 (mild): Secondary | ICD-10-CM | POA: Diagnosis not present

## 2023-01-07 HISTORY — DX: Chronic kidney disease, stage 2 (mild): N18.2

## 2023-01-07 HISTORY — PX: INGUINAL HERNIA REPAIR: SHX194

## 2023-01-07 HISTORY — DX: Migraine, unspecified, not intractable, without status migrainosus: G43.909

## 2023-01-07 LAB — BASIC METABOLIC PANEL
Anion gap: 9 (ref 5–15)
BUN: 25 mg/dL — ABNORMAL HIGH (ref 6–20)
CO2: 25 mmol/L (ref 22–32)
Calcium: 9.6 mg/dL (ref 8.9–10.3)
Chloride: 95 mmol/L — ABNORMAL LOW (ref 98–111)
Creatinine, Ser: 1.29 mg/dL — ABNORMAL HIGH (ref 0.44–1.00)
GFR, Estimated: 49 mL/min — ABNORMAL LOW (ref >60)
Glucose, Bld: 80 mg/dL (ref 70–99)
Potassium: 3.9 mmol/L (ref 3.5–5.1)
Sodium: 129 mmol/L — ABNORMAL LOW (ref 135–145)

## 2023-01-07 LAB — CBC
HCT: 34.7 % — ABNORMAL LOW (ref 36.0–46.0)
Hemoglobin: 11.6 g/dL — ABNORMAL LOW (ref 12.0–15.0)
MCH: 29.1 pg (ref 26.0–34.0)
MCHC: 33.4 g/dL (ref 30.0–36.0)
MCV: 87.2 fL (ref 80.0–100.0)
Platelets: 253 10*3/uL (ref 150–400)
RBC: 3.98 MIL/uL (ref 3.87–5.11)
RDW: 12.5 % (ref 11.5–15.5)
WBC: 3.9 10*3/uL — ABNORMAL LOW (ref 4.0–10.5)
nRBC: 0 % (ref 0.0–0.2)

## 2023-01-07 SURGERY — REPAIR, HERNIA, INGUINAL, ADULT
Anesthesia: General | Laterality: Right

## 2023-01-07 MED ORDER — ORAL CARE MOUTH RINSE: 15.0000 mL | Freq: Once | OROMUCOSAL | Status: AC

## 2023-01-07 MED ORDER — PROPOFOL 10 MG/ML IV BOLUS
INTRAVENOUS | Status: DC | PRN
  Administered 2023-01-07: 130 mg via INTRAVENOUS
  Administered 2023-01-07: 70 mg via INTRAVENOUS

## 2023-01-07 MED ORDER — ONDANSETRON HCL 4 MG/2ML IJ SOLN
4.0000 mg | Freq: Once | INTRAMUSCULAR | Status: AC | PRN
  Administered 2023-01-07: 4 mg via INTRAVENOUS

## 2023-01-07 MED ORDER — GABAPENTIN 300 MG PO CAPS
300.0000 mg | ORAL_CAPSULE | Freq: Once | ORAL | Status: AC
  Administered 2023-01-07: 300 mg via ORAL
  Filled 2023-01-07: qty 1

## 2023-01-07 MED ORDER — 0.9 % SODIUM CHLORIDE (POUR BTL) OPTIME
TOPICAL | Status: DC | PRN
  Administered 2023-01-07: 1000 mL

## 2023-01-07 MED ORDER — AMISULPRIDE (ANTIEMETIC) 5 MG/2ML IV SOLN: 10.0000 mg | Freq: Once | INTRAVENOUS | Status: DC | PRN

## 2023-01-07 MED ORDER — BUPIVACAINE LIPOSOME 1.3 % IJ SUSP
INTRAMUSCULAR | Status: AC
  Filled 2023-01-07: qty 20

## 2023-01-07 MED ORDER — ACETAMINOPHEN 500 MG PO TABS
1000.0000 mg | ORAL_TABLET | Freq: Once | ORAL | Status: AC
  Administered 2023-01-07: 1000 mg via ORAL
  Filled 2023-01-07: qty 2

## 2023-01-07 MED ORDER — ONDANSETRON HCL 4 MG/2ML IJ SOLN
INTRAMUSCULAR | Status: DC | PRN
  Administered 2023-01-07: 4 mg via INTRAVENOUS

## 2023-01-07 MED ORDER — MIDAZOLAM HCL 2 MG/2ML IJ SOLN
INTRAMUSCULAR | Status: AC
  Filled 2023-01-07: qty 2

## 2023-01-07 MED ORDER — CIPROFLOXACIN IN D5W 400 MG/200ML IV SOLN
400.0000 mg | INTRAVENOUS | Status: AC
  Administered 2023-01-07: 400 mg via INTRAVENOUS
  Filled 2023-01-07: qty 200

## 2023-01-07 MED ORDER — ROCURONIUM BROMIDE 10 MG/ML (PF) SYRINGE
PREFILLED_SYRINGE | INTRAVENOUS | Status: AC
  Filled 2023-01-07: qty 10

## 2023-01-07 MED ORDER — PHENYLEPHRINE 80 MCG/ML (10ML) SYRINGE FOR IV PUSH (FOR BLOOD PRESSURE SUPPORT)
PREFILLED_SYRINGE | INTRAVENOUS | Status: DC | PRN
  Administered 2023-01-07: 120 ug via INTRAVENOUS
  Administered 2023-01-07: 160 ug via INTRAVENOUS
  Administered 2023-01-07: 120 ug via INTRAVENOUS
  Administered 2023-01-07: 160 ug via INTRAVENOUS
  Administered 2023-01-07: 120 ug via INTRAVENOUS

## 2023-01-07 MED ORDER — FENTANYL CITRATE (PF) 250 MCG/5ML IJ SOLN
INTRAMUSCULAR | Status: DC | PRN
  Administered 2023-01-07: 50 ug via INTRAVENOUS
  Administered 2023-01-07: 100 ug via INTRAVENOUS

## 2023-01-07 MED ORDER — FENTANYL CITRATE PF 50 MCG/ML IJ SOSY
25.0000 ug | PREFILLED_SYRINGE | INTRAMUSCULAR | Status: DC | PRN
  Administered 2023-01-07: 50 ug via INTRAVENOUS

## 2023-01-07 MED ORDER — MIDAZOLAM HCL 2 MG/2ML IJ SOLN: 1.0000 mg | INTRAMUSCULAR | Status: DC

## 2023-01-07 MED ORDER — LIDOCAINE HCL (PF) 2 % IJ SOLN
INTRAMUSCULAR | Status: AC
  Filled 2023-01-07: qty 5

## 2023-01-07 MED ORDER — ROCURONIUM BROMIDE 10 MG/ML (PF) SYRINGE
PREFILLED_SYRINGE | INTRAVENOUS | Status: DC | PRN
  Administered 2023-01-07: 60 mg via INTRAVENOUS

## 2023-01-07 MED ORDER — RIZATRIPTAN BENZOATE 5 MG PO TBDP
ORAL_TABLET | ORAL | 2 refills | Status: AC
Start: 2023-01-07 — End: ?

## 2023-01-07 MED ORDER — PROPOFOL 10 MG/ML IV BOLUS
INTRAVENOUS | Status: AC
  Filled 2023-01-07: qty 20

## 2023-01-07 MED ORDER — SUGAMMADEX SODIUM 200 MG/2ML IV SOLN
INTRAVENOUS | Status: DC | PRN
  Administered 2023-01-07: 200 mg via INTRAVENOUS

## 2023-01-07 MED ORDER — MIDAZOLAM HCL 5 MG/5ML IJ SOLN
INTRAMUSCULAR | Status: DC | PRN
  Administered 2023-01-07: 2 mg via INTRAVENOUS

## 2023-01-07 MED ORDER — ONDANSETRON HCL 4 MG/2ML IJ SOLN
INTRAMUSCULAR | Status: AC
  Filled 2023-01-07: qty 2

## 2023-01-07 MED ORDER — EPHEDRINE 5 MG/ML INJ
INTRAVENOUS | Status: AC
  Filled 2023-01-07: qty 5

## 2023-01-07 MED ORDER — PROPOFOL 500 MG/50ML IV EMUL
INTRAVENOUS | Status: AC
  Filled 2023-01-07: qty 50

## 2023-01-07 MED ORDER — FENTANYL CITRATE PF 50 MCG/ML IJ SOSY
PREFILLED_SYRINGE | INTRAMUSCULAR | Status: AC
  Filled 2023-01-07: qty 1

## 2023-01-07 MED ORDER — FENTANYL CITRATE (PF) 250 MCG/5ML IJ SOLN
INTRAMUSCULAR | Status: AC
  Filled 2023-01-07: qty 5

## 2023-01-07 MED ORDER — CHLORHEXIDINE GLUCONATE CLOTH 2 % EX PADS: 6.0000 | MEDICATED_PAD | Freq: Once | CUTANEOUS | Status: DC

## 2023-01-07 MED ORDER — LIDOCAINE 2% (20 MG/ML) 5 ML SYRINGE
INTRAMUSCULAR | Status: DC | PRN
  Administered 2023-01-07: 80 mg via INTRAVENOUS

## 2023-01-07 MED ORDER — OXYCODONE HCL 5 MG PO TABS
ORAL_TABLET | ORAL | Status: AC
  Administered 2023-01-07: 5 mg
  Filled 2023-01-07: qty 1

## 2023-01-07 MED ORDER — CELECOXIB 200 MG PO CAPS
200.0000 mg | ORAL_CAPSULE | Freq: Once | ORAL | Status: AC
  Administered 2023-01-07: 200 mg via ORAL
  Filled 2023-01-07: qty 1

## 2023-01-07 MED ORDER — OXYCODONE HCL 5 MG PO TABS: 5.0000 mg | ORAL_TABLET | Freq: Four times a day (QID) | ORAL | 0 refills | Status: AC | PRN

## 2023-01-07 MED ORDER — SCOPOLAMINE 1 MG/3DAYS TD PT72
1.0000 | MEDICATED_PATCH | Freq: Once | TRANSDERMAL | Status: DC
  Administered 2023-01-07: 1.5 mg via TRANSDERMAL
  Filled 2023-01-07: qty 1

## 2023-01-07 MED ORDER — BUPIVACAINE LIPOSOME 1.3 % IJ SUSP
INTRAMUSCULAR | Status: DC | PRN
  Administered 2023-01-07: 20 mL

## 2023-01-07 MED ORDER — FENTANYL CITRATE PF 50 MCG/ML IJ SOSY: 50.0000 ug | PREFILLED_SYRINGE | INTRAMUSCULAR | Status: DC

## 2023-01-07 MED ORDER — PHENYLEPHRINE 80 MCG/ML (10ML) SYRINGE FOR IV PUSH (FOR BLOOD PRESSURE SUPPORT)
PREFILLED_SYRINGE | INTRAVENOUS | Status: AC
  Filled 2023-01-07: qty 10

## 2023-01-07 MED ORDER — DEXAMETHASONE SODIUM PHOSPHATE 10 MG/ML IJ SOLN
INTRAMUSCULAR | Status: AC
  Filled 2023-01-07: qty 1

## 2023-01-07 MED ORDER — OXYCODONE HCL 5 MG PO TABS: 5.0000 mg | ORAL_TABLET | Freq: Once | ORAL | Status: DC

## 2023-01-07 MED ORDER — BUPIVACAINE HCL (PF) 0.5 % IJ SOLN
INTRAMUSCULAR | Status: DC | PRN
  Administered 2023-01-07: 20 mL

## 2023-01-07 MED ORDER — EPHEDRINE SULFATE-NACL 50-0.9 MG/10ML-% IV SOSY
PREFILLED_SYRINGE | INTRAVENOUS | Status: DC | PRN
  Administered 2023-01-07 (×2): 10 mg via INTRAVENOUS
  Administered 2023-01-07 (×2): 5 mg via INTRAVENOUS

## 2023-01-07 MED ORDER — CHLORHEXIDINE GLUCONATE 0.12 % MT SOLN
15.0000 mL | Freq: Once | OROMUCOSAL | Status: AC
  Administered 2023-01-07: 15 mL via OROMUCOSAL

## 2023-01-07 MED ORDER — DEXAMETHASONE SODIUM PHOSPHATE 4 MG/ML IJ SOLN
INTRAMUSCULAR | Status: DC | PRN
  Administered 2023-01-07: 5 mg via INTRAVENOUS

## 2023-01-07 MED ORDER — BUPIVACAINE HCL (PF) 0.5 % IJ SOLN
INTRAMUSCULAR | Status: AC
  Filled 2023-01-07: qty 30

## 2023-01-07 MED ORDER — LACTATED RINGERS IV SOLN: INTRAVENOUS | Status: DC

## 2023-01-07 SURGICAL SUPPLY — 37 items
ADH SKN CLS APL DERMABOND .7 (GAUZE/BANDAGES/DRESSINGS) ×1
APL PRP STRL LF DISP 70% ISPRP (MISCELLANEOUS) ×2
BAG COUNTER SPONGE SURGICOUNT (BAG) ×1 IMPLANT
BAG SPNG CNTER NS LX DISP (BAG) ×1
BLADE SURG 15 STRL LF DISP TIS (BLADE) ×1 IMPLANT
BLADE SURG 15 STRL SS (BLADE) ×1
CHLORAPREP W/TINT 26 (MISCELLANEOUS) ×2 IMPLANT
COVER SURGICAL LIGHT HANDLE (MISCELLANEOUS) ×1 IMPLANT
DERMABOND ADVANCED .7 DNX12 (GAUZE/BANDAGES/DRESSINGS) IMPLANT
DRAIN PENROSE 0.5X18 (DRAIN) ×1 IMPLANT
DRAPE LAPAROTOMY TRNSV 102X78 (DRAPES) ×1 IMPLANT
DRAPE UTILITY XL STRL (DRAPES) ×1 IMPLANT
ELECT REM PT RETURN 15FT ADLT (MISCELLANEOUS) ×1 IMPLANT
GLOVE BIOGEL PI IND STRL 7.0 (GLOVE) ×1 IMPLANT
GLOVE SURG ORTHO 8.0 STRL STRW (GLOVE) ×1 IMPLANT
GOWN STRL REUS W/ TWL XL LVL3 (GOWN DISPOSABLE) ×2 IMPLANT
GOWN STRL REUS W/TWL XL LVL3 (GOWN DISPOSABLE) ×2
KIT BASIN OR (CUSTOM PROCEDURE TRAY) ×1 IMPLANT
KIT TURNOVER KIT A (KITS) IMPLANT
MARKER SKIN DUAL TIP RULER LAB (MISCELLANEOUS) ×1 IMPLANT
MESH ULTRAPRO 3X6 7.6X15CM (Mesh General) IMPLANT
NDL HYPO 25X1 1.5 SAFETY (NEEDLE) ×1 IMPLANT
NEEDLE HYPO 25X1 1.5 SAFETY (NEEDLE) ×1
NS IRRIG 1000ML POUR BTL (IV SOLUTION) ×1 IMPLANT
PACK BASIC VI WITH GOWN DISP (CUSTOM PROCEDURE TRAY) ×1 IMPLANT
PENCIL SMOKE EVACUATOR (MISCELLANEOUS) ×1 IMPLANT
SPIKE FLUID TRANSFER (MISCELLANEOUS) ×1 IMPLANT
SPONGE T-LAP 4X18 ~~LOC~~+RFID (SPONGE) ×1 IMPLANT
STRIP CLOSURE SKIN 1/2X4 (GAUZE/BANDAGES/DRESSINGS) ×1 IMPLANT
SUT MNCRL AB 4-0 PS2 18 (SUTURE) ×1 IMPLANT
SUT NOVA NAB GS-21 0 18 T12 DT (SUTURE) IMPLANT
SUT NOVA NAB GS-22 2 0 T19 (SUTURE) ×2 IMPLANT
SUT SILK 2 0 SH (SUTURE) IMPLANT
SUT VIC AB 3-0 SH 18 (SUTURE) ×1 IMPLANT
SYR BULB IRRIG 60ML STRL (SYRINGE) ×1 IMPLANT
SYR CONTROL 10ML LL (SYRINGE) ×1 IMPLANT
TOWEL OR 17X26 10 PK STRL BLUE (TOWEL DISPOSABLE) ×1 IMPLANT

## 2023-01-07 NOTE — Anesthesia Procedure Notes (Signed)
Procedure Name: Intubation Date/Time: 01/07/2023 9:30 AM  Performed by: Vanessa Winston, CRNAPre-anesthesia Checklist: Patient identified, Emergency Drugs available, Suction available and Patient being monitored Patient Re-evaluated:Patient Re-evaluated prior to induction Oxygen Delivery Method: Circle system utilized Preoxygenation: Pre-oxygenation with 100% oxygen Induction Type: IV induction Ventilation: Mask ventilation without difficulty Laryngoscope Size: 2 and Miller Grade View: Grade I Tube type: Oral Tube size: 7.0 mm Number of attempts: 1 Airway Equipment and Method: Stylet Placement Confirmation: ETT inserted through vocal cords under direct vision, positive ETCO2 and breath sounds checked- equal and bilateral Secured at: 21 cm Tube secured with: Tape Dental Injury: Teeth and Oropharynx as per pre-operative assessment

## 2023-01-07 NOTE — Op Note (Signed)
Operative Note  Pre-operative Diagnosis: Right inguinal hernia, reducible  Post-operative Diagnosis: Same  Surgeon:  Darnell Level, MD  Assistant:  Saunders Glance, PA-C   Procedure: Open repair right inguinal hernia with mesh  Anesthesia: General  Estimated Blood Loss: Minimal  Drains: None         Specimen: None  Indications:  Patient is referred by Dr. Ranee Gosselin for surgical evaluation and management of a newly diagnosed right inguinal hernia. Patient's primary care physician is Dr. Ellsworth Lennox. Patient first noted a small bulge in the right groin a few years ago. This has gradually increased in size. It is not completely reducible. Patient has noted some mild constipation but no episodes of bowel obstruction. She has undergone a previous abdominoplasty. She has had occasional mild discomfort in the right groin. She has had no prior hernia repairs. She presents today to discuss right inguinal hernia repair.   Procedure:  The patient was seen in the pre-op holding area. The risks, benefits, complications, treatment options, and expected outcomes were previously discussed with the patient. The patient agreed with the proposed plan and has signed the informed consent form.  The patient was brought to the operating room by the surgical team, identified as Dynasty B Laureano and the procedure verified. A "time out" was completed and the above information confirmed.  Following induction of general anesthesia, the patient was positioned and then prepped and draped in usual aseptic fashion.  After ascertaining that an adequate level of anesthesia been achieved, a right inguinal incision is made with a #15 blade.  Dissection is carried through subcutaneous tissues and hemostasis achieved with the electrocautery.  External oblique fascia is identified and incised in line with its fibers.  The inguinal canal is developed.  There is a moderate-sized bulge emanating from the internal inguinal ring.  This  is dissected out of the inguinal canal and reduced.  The inguinal ring is then closed with interrupted 0 Novafil simple sutures.  Next a sheet of Ethicon ultra Pro mesh is trimmed to the appropriate dimensions and is inserted into the space between the internal oblique fascia and the external oblique fascia.  Mesh is secured to the pubic tubercle and along the inguinal ligament with a running 2-0 Novafil suture.  Mesh is secured to the internal oblique and transversalis fascia with interrupted 2-0 Novafil sutures.  This provides excellent coverage of the area of potential recurrence from a direct inguinal hernia or indirect inguinal hernia.  Local anesthetic with Exparel and Marcaine is then injected throughout the operative field.  External oblique fascia is closed with interrupted 3-0 Vicryl sutures.  Subcutaneous tissues are closed with interrupted 3-0 Vicryl sutures.  Skin is anesthetized with Exparel and Marcaine.  Skin edges are reapproximated with a running 4-0 Monocryl subcuticular suture.  Dermabond is applied as dressing.  Patient is awakened from anesthesia and transported to the recovery room in stable condition.  The patient tolerated the procedure well.   Darnell Level, MD [facility] Office: [phone]

## 2023-01-07 NOTE — Interval H&P Note (Signed)
History and Physical Interval Note:  01/07/2023 8:59 AM  Melanie Reid  has presented today for surgery, with the diagnosis of RIGHT INGUINAL HERNIA.  The various methods of treatment have been discussed with the patient and family. After consideration of risks, benefits and other options for treatment, the patient has consented to    Procedure(s): OPEN REPAIR RIGHT INGUINAL HERNIA WITH MESH (Right) as a surgical intervention.    The patient's history has been reviewed, patient examined, no change in status, stable for surgery.  I have reviewed the patient's chart and labs.  Questions were answered to the patient's satisfaction.    Darnell Level, MD [facility] practice Office: [phone]   Darnell Level

## 2023-01-07 NOTE — Anesthesia Preprocedure Evaluation (Signed)
Anesthesia Evaluation  Patient identified by MRN, date of birth, ID band Patient awake    Reviewed: Allergy & Precautions, NPO status , Patient's Chart, lab work & pertinent test results  History of Anesthesia Complications (+) PONV and history of anesthetic complications  Airway Mallampati: II  TM Distance: >3 FB Neck ROM: Full    Dental  (+) Teeth Intact, Dental Advisory Given   Pulmonary neg pulmonary ROS   Pulmonary exam normal breath sounds clear to auscultation       Cardiovascular hypertension, Pt. on medications + DVT  Normal cardiovascular exam Rhythm:Regular Rate:Normal     Neuro/Psych  Headaches    GI/Hepatic ,GERD  Medicated,,(+)     substance abuse  cocaine useRIGHT INGUINAL HERNIA   Endo/Other  negative endocrine ROS    Renal/GU Renal InsufficiencyRenal disease     Musculoskeletal  (+) Arthritis ,    Abdominal   Peds  Hematology negative hematology ROS (+)   Anesthesia Other Findings Day of surgery medications reviewed with the patient.  Reproductive/Obstetrics                              Anesthesia Physical Anesthesia Plan  ASA: 3  Anesthesia Plan: General   Post-op Pain Management: Tylenol PO (pre-op)* and Celebrex PO (pre-op)*   Induction:   PONV Risk Score and Plan: 4 or greater and Scopolamine patch - Pre-op, Midazolam, Dexamethasone and Ondansetron  Airway Management Planned: Oral ETT  Additional Equipment:   Intra-op Plan:   Post-operative Plan: Extubation in OR  Informed Consent: I have reviewed the patients History and Physical, chart, labs and discussed the procedure including the risks, benefits and alternatives for the proposed anesthesia with the patient or authorized representative who has indicated his/her understanding and acceptance.     Dental advisory given  Plan Discussed with: CRNA  Anesthesia Plan Comments:           Anesthesia Quick Evaluation

## 2023-01-07 NOTE — Transfer of Care (Signed)
Immediate Anesthesia Transfer of Care Note  Patient: Melanie Reid  Procedure(s) Performed: OPEN REPAIR RIGHT INGUINAL HERNIA WITH MESH (Right)  Patient Location: PACU  Anesthesia Type:General  Level of Consciousness: drowsy  Airway & Oxygen Therapy: Patient Spontanous Breathing and Patient connected to face mask oxygen  Post-op Assessment: Report given to RN and Post -op Vital signs reviewed and stable  Post vital signs: Reviewed and stable  Last Vitals:  Vitals Value Taken Time  BP 111/62 01/07/23 1045  Temp    Pulse 64 01/07/23 1046  Resp 17 01/07/23 1046  SpO2 100 % 01/07/23 1046  Vitals shown include unfiled device data.  Last Pain:  Vitals:   01/07/23 0852  TempSrc:   PainSc: 0-No pain         Complications: No notable events documented.

## 2023-01-07 NOTE — Discharge Instructions (Addendum)
[facility]  HERNIA REPAIR POST OP INSTRUCTIONS  Always review your discharge instruction sheet given to you by the facility where your surgery was performed.  A  prescription for pain medication may be sent to your pharmacy on discharge.  Take your pain medication as prescribed.  If narcotic pain medicine is not needed, then you may take acetaminophen (Tylenol) or ibuprofen (Advil) as needed.  Take your usually prescribed medications unless otherwise directed.  If you need a refill on your pain medication, please contact your pharmacy.  They will contact our office to request authorization. Prescriptions will not be filled after 5:00 PM daily or on weekends.  You should follow a light diet the first 24 hours after arrival home, such as soup and crackers or toast.  Be sure to include plenty of fluids daily.  Resume your normal diet the day after surgery.  Most patients will experience some swelling and bruising around the surgical site.  Ice packs and reclining will help.  Swelling and bruising can take several days to resolve.   It is common to experience some constipation if taking pain medication after surgery.  Increasing fluid intake and taking a stool softener (such as Colace) will usually help or prevent this problem from occurring.  A mild laxative (Milk of Magnesia or Miralax) should be taken according to package directions if there is no bowel movement after 48 hours.  You will likely have Dermabond (topical glue) over your incisions.  This seals the incisions and allows you to bathe and shower at any time after your surgery.  Glue should remain in place for up to 10 days.  It may be removed after 10 days by pealing off the Dermabond material or using Vaseline or naval jelly to remove.  ACTIVITIES:  You may resume regular (light) daily activities beginning the next day - such as daily self-care, walking, climbing stairs - gradually increasing activities as tolerated.  You  may have sexual intercourse when it is comfortable.  Refrain from any heavy lifting or straining until approved by your doctor.  You may drive when you are no longer taking prescription pain medication, when you can comfortably wear a seatbelt, and when you can safely maneuver your car and apply the brakes.  You should see your doctor in the office for a follow-up appointment approximately 2-3 weeks after your surgery.  Make sure that you call for this appointment within a day or two after you arrive home to insure a convenient appointment time.  WHEN TO CALL YOUR DOCTOR: Fever greater than 101.0 Inability to urinate Persistent nausea and/or vomiting Extreme swelling or bruising Continued bleeding from incision Increased pain, redness, or drainage from the incision  The clinic staff is available to answer your questions during regular business hours.  Please don't hesitate to call and ask to speak to one of the nurses for clinical concerns.  If you have a medical emergency, go to the nearest emergency room or call 911.  A surgeon from [facility] is always on call for the hospital.   [facility] [address], [address], [place], [place]  [address]  [phone] ? [phone] ? [phone] 

## 2023-01-07 NOTE — Progress Notes (Signed)
Attempted to contact patient and patiens daughter, no answer.  Left designated HIPAA voicemail for patient to return call about arrival for surgery.

## 2023-01-08 ENCOUNTER — Encounter (HOSPITAL_COMMUNITY): Payer: Self-pay | Admitting: Surgery

## 2023-01-08 NOTE — Anesthesia Postprocedure Evaluation (Signed)
Anesthesia Post Note  Patient: Melanie Reid  Procedure(s) Performed: OPEN REPAIR RIGHT INGUINAL HERNIA WITH MESH (Right)     Patient location during evaluation: PACU Anesthesia Type: General Level of consciousness: awake and alert Pain management: pain level controlled Vital Signs Assessment: post-procedure vital signs reviewed and stable Respiratory status: spontaneous breathing, nonlabored ventilation, respiratory function stable and patient connected to nasal cannula oxygen Cardiovascular status: blood pressure returned to baseline and stable Postop Assessment: no apparent nausea or vomiting Anesthetic complications: no   No notable events documented.  Last Vitals:  Vitals:   01/07/23 1209 01/07/23 1315  BP: 106/63 108/65  Pulse: 60 72  Resp: 18 16  Temp: 36.5 C   SpO2: 99% 98%    Last Pain:  Vitals:   01/07/23 1315  TempSrc:   PainSc: 5                  Collene Schlichter

## 2023-01-20 ENCOUNTER — Other Ambulatory Visit: Payer: Self-pay | Admitting: Internal Medicine

## 2023-01-20 DIAGNOSIS — I10 Essential (primary) hypertension: Secondary | ICD-10-CM

## 2023-01-20 DIAGNOSIS — K219 Gastro-esophageal reflux disease without esophagitis: Secondary | ICD-10-CM

## 2023-01-31 ENCOUNTER — Other Ambulatory Visit: Payer: Self-pay | Admitting: Internal Medicine

## 2023-01-31 DIAGNOSIS — I10 Essential (primary) hypertension: Secondary | ICD-10-CM

## 2023-02-01 ENCOUNTER — Other Ambulatory Visit: Payer: Self-pay | Admitting: Internal Medicine

## 2023-02-11 DIAGNOSIS — H43811 Vitreous degeneration, right eye: Secondary | ICD-10-CM | POA: Diagnosis not present

## 2023-02-11 DIAGNOSIS — H35431 Paving stone degeneration of retina, right eye: Secondary | ICD-10-CM | POA: Diagnosis not present

## 2023-02-11 DIAGNOSIS — D3191 Benign neoplasm of unspecified part of right eye: Secondary | ICD-10-CM | POA: Diagnosis not present

## 2023-02-11 DIAGNOSIS — H5319 Other subjective visual disturbances: Secondary | ICD-10-CM | POA: Diagnosis not present

## 2023-02-13 ENCOUNTER — Encounter: Payer: Self-pay | Admitting: Optometry

## 2023-03-20 DIAGNOSIS — H04121 Dry eye syndrome of right lacrimal gland: Secondary | ICD-10-CM | POA: Diagnosis not present

## 2023-03-20 DIAGNOSIS — H43811 Vitreous degeneration, right eye: Secondary | ICD-10-CM | POA: Diagnosis not present

## 2023-03-21 ENCOUNTER — Encounter: Payer: Self-pay | Admitting: Optometry

## 2023-03-24 ENCOUNTER — Encounter: Payer: Self-pay | Admitting: Internal Medicine

## 2023-03-27 ENCOUNTER — Other Ambulatory Visit: Payer: Self-pay | Admitting: Internal Medicine

## 2023-03-27 ENCOUNTER — Ambulatory Visit (INDEPENDENT_AMBULATORY_CARE_PROVIDER_SITE_OTHER): Payer: Medicare HMO | Admitting: Internal Medicine

## 2023-03-27 VITALS — BP 110/85 | HR 83 | Ht 65.0 in | Wt 157.0 lb

## 2023-03-27 DIAGNOSIS — E782 Mixed hyperlipidemia: Secondary | ICD-10-CM | POA: Diagnosis not present

## 2023-03-27 DIAGNOSIS — K219 Gastro-esophageal reflux disease without esophagitis: Secondary | ICD-10-CM

## 2023-03-27 DIAGNOSIS — G43E09 Chronic migraine with aura, not intractable, without status migrainosus: Secondary | ICD-10-CM | POA: Diagnosis not present

## 2023-03-27 DIAGNOSIS — F329 Major depressive disorder, single episode, unspecified: Secondary | ICD-10-CM

## 2023-03-27 DIAGNOSIS — I10 Essential (primary) hypertension: Secondary | ICD-10-CM | POA: Diagnosis not present

## 2023-03-27 MED ORDER — OLMESARTAN MEDOXOMIL 40 MG PO TABS
40.0000 mg | ORAL_TABLET | Freq: Every day | ORAL | 0 refills | Status: DC
Start: 2023-03-27 — End: 2023-06-24

## 2023-03-27 MED ORDER — DULOXETINE HCL 60 MG PO CPEP
60.0000 mg | ORAL_CAPSULE | Freq: Every morning | ORAL | 0 refills | Status: DC
Start: 2023-03-27 — End: 2023-06-24

## 2023-03-27 MED ORDER — PANTOPRAZOLE SODIUM 40 MG PO TBEC
40.0000 mg | DELAYED_RELEASE_TABLET | ORAL | 0 refills | Status: DC
Start: 2023-03-27 — End: 2023-05-10

## 2023-03-27 MED ORDER — RIZATRIPTAN BENZOATE 5 MG PO TBDP
ORAL_TABLET | ORAL | 2 refills | Status: DC
Start: 2023-03-27 — End: 2024-01-22

## 2023-03-27 MED ORDER — ATORVASTATIN CALCIUM 10 MG PO TABS
10.0000 mg | ORAL_TABLET | Freq: Every day | ORAL | 0 refills | Status: DC
Start: 2023-03-27 — End: 2023-04-01
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
## 2023-03-28 LAB — LIPID PANEL
Chol/HDL Ratio: 5.1 {ratio} — ABNORMAL HIGH (ref 0.0–4.4)
Cholesterol, Total: 257 mg/dL — ABNORMAL HIGH (ref 100–199)
HDL: 50 mg/dL (ref >39)
LDL Chol Calc (NIH): 167 mg/dL — ABNORMAL HIGH (ref 0–99)
Triglycerides: 214 mg/dL — ABNORMAL HIGH (ref 0–149)
VLDL Cholesterol Cal: 40 mg/dL (ref 5–40)

## 2023-03-28 LAB — CBC WITH DIFF/PLATELET
Basophils Absolute: 0 10*3/uL (ref 0.0–0.2)
Basos: 1 %
EOS (ABSOLUTE): 0.3 10*3/uL (ref 0.0–0.4)
Eos: 6 %
Hematocrit: 35.8 % (ref 34.0–46.6)
Hemoglobin: 11.5 g/dL (ref 11.1–15.9)
Immature Grans (Abs): 0 10*3/uL (ref 0.0–0.1)
Immature Granulocytes: 0 %
Lymphocytes Absolute: 1.5 10*3/uL (ref 0.7–3.1)
Lymphs: 39 %
MCH: 29.6 pg (ref 26.6–33.0)
MCHC: 32.1 g/dL (ref 31.5–35.7)
MCV: 92 fL (ref 79–97)
Monocytes Absolute: 0.4 10*3/uL (ref 0.1–0.9)
Monocytes: 10 %
Neutrophils Absolute: 1.7 10*3/uL (ref 1.4–7.0)
Neutrophils: 44 %
Platelets: 231 10*3/uL (ref 150–450)
RBC: 3.88 x10E6/uL (ref 3.77–5.28)
RDW: 13.4 % (ref 11.7–15.4)
WBC: 3.9 10*3/uL (ref 3.4–10.8)

## 2023-03-28 LAB — COMPREHENSIVE METABOLIC PANEL
ALT: 19 [IU]/L (ref 0–32)
AST: 23 [IU]/L (ref 0–40)
Albumin: 4.5 g/dL (ref 3.8–4.9)
Alkaline Phosphatase: 156 [IU]/L — ABNORMAL HIGH (ref 44–121)
BUN/Creatinine Ratio: 18 (ref 9–23)
BUN: 23 mg/dL (ref 6–24)
Bilirubin Total: 0.3 mg/dL (ref 0.0–1.2)
CO2: 25 mmol/L (ref 20–29)
Calcium: 9.8 mg/dL (ref 8.7–10.2)
Chloride: 97 mmol/L (ref 96–106)
Creatinine, Ser: 1.25 mg/dL — ABNORMAL HIGH (ref 0.57–1.00)
Globulin, Total: 2.7 g/dL (ref 1.5–4.5)
Glucose: 92 mg/dL (ref 70–99)
Potassium: 4.1 mmol/L (ref 3.5–5.2)
Sodium: 136 mmol/L (ref 134–144)
Total Protein: 7.2 g/dL (ref 6.0–8.5)
eGFR: 51 mL/min/{1.73_m2} — ABNORMAL LOW (ref >59)

## 2023-03-29 ENCOUNTER — Other Ambulatory Visit: Payer: Self-pay

## 2023-04-01 ENCOUNTER — Other Ambulatory Visit: Payer: Self-pay | Admitting: Internal Medicine

## 2023-04-01 DIAGNOSIS — E782 Mixed hyperlipidemia: Secondary | ICD-10-CM

## 2023-04-01 MED ORDER — ROSUVASTATIN CALCIUM 20 MG PO TABS
20.0000 mg | ORAL_TABLET | Freq: Every day | ORAL | 3 refills | Status: DC
Start: 2023-04-01 — End: 2024-04-07

## 2023-04-01 NOTE — Progress Notes (Signed)
Spoke with patient who verbalized understanding.

## 2023-04-05 ENCOUNTER — Encounter: Payer: Self-pay | Admitting: Optometry

## 2023-04-05 DIAGNOSIS — H35431 Paving stone degeneration of retina, right eye: Secondary | ICD-10-CM | POA: Diagnosis not present

## 2023-04-05 DIAGNOSIS — H43811 Vitreous degeneration, right eye: Secondary | ICD-10-CM | POA: Diagnosis not present

## 2023-04-05 DIAGNOSIS — H5319 Other subjective visual disturbances: Secondary | ICD-10-CM | POA: Diagnosis not present

## 2023-04-05 DIAGNOSIS — H04121 Dry eye syndrome of right lacrimal gland: Secondary | ICD-10-CM | POA: Diagnosis not present

## 2023-04-05 DIAGNOSIS — H524 Presbyopia: Secondary | ICD-10-CM | POA: Diagnosis not present

## 2023-05-08 ENCOUNTER — Other Ambulatory Visit: Payer: Self-pay | Admitting: Internal Medicine

## 2023-05-09 ENCOUNTER — Other Ambulatory Visit: Payer: Self-pay | Admitting: Internal Medicine

## 2023-05-09 DIAGNOSIS — K219 Gastro-esophageal reflux disease without esophagitis: Secondary | ICD-10-CM

## 2023-05-25 ENCOUNTER — Other Ambulatory Visit: Payer: Self-pay | Admitting: Internal Medicine

## 2023-06-24 ENCOUNTER — Other Ambulatory Visit: Payer: Self-pay | Admitting: Internal Medicine

## 2023-06-24 DIAGNOSIS — F329 Major depressive disorder, single episode, unspecified: Secondary | ICD-10-CM

## 2023-06-24 DIAGNOSIS — I10 Essential (primary) hypertension: Secondary | ICD-10-CM

## 2023-07-05 ENCOUNTER — Ambulatory Visit: Payer: Medicare HMO | Admitting: Internal Medicine

## 2023-07-17 ENCOUNTER — Ambulatory Visit: Payer: Medicare HMO | Admitting: Internal Medicine

## 2023-07-30 ENCOUNTER — Other Ambulatory Visit: Payer: Self-pay | Admitting: Internal Medicine

## 2023-07-30 DIAGNOSIS — I10 Essential (primary) hypertension: Secondary | ICD-10-CM

## 2023-08-03 ENCOUNTER — Other Ambulatory Visit: Payer: Self-pay | Admitting: Internal Medicine

## 2023-08-05 ENCOUNTER — Other Ambulatory Visit: Payer: Self-pay | Admitting: Internal Medicine

## 2023-08-05 DIAGNOSIS — K219 Gastro-esophageal reflux disease without esophagitis: Secondary | ICD-10-CM

## 2023-08-07 ENCOUNTER — Other Ambulatory Visit: Payer: Self-pay

## 2023-08-15 ENCOUNTER — Other Ambulatory Visit: Payer: Self-pay | Admitting: Internal Medicine

## 2023-08-25 ENCOUNTER — Other Ambulatory Visit: Payer: Self-pay | Admitting: Internal Medicine

## 2023-09-19 ENCOUNTER — Other Ambulatory Visit: Payer: Self-pay | Admitting: Internal Medicine

## 2023-09-19 DIAGNOSIS — K219 Gastro-esophageal reflux disease without esophagitis: Secondary | ICD-10-CM

## 2023-09-19 DIAGNOSIS — F329 Major depressive disorder, single episode, unspecified: Secondary | ICD-10-CM

## 2023-09-19 DIAGNOSIS — I10 Essential (primary) hypertension: Secondary | ICD-10-CM

## 2023-09-20 ENCOUNTER — Other Ambulatory Visit: Payer: Self-pay | Admitting: Internal Medicine

## 2023-09-20 DIAGNOSIS — I10 Essential (primary) hypertension: Secondary | ICD-10-CM

## 2023-10-18 ENCOUNTER — Other Ambulatory Visit: Payer: Self-pay | Admitting: Internal Medicine

## 2023-10-18 DIAGNOSIS — I10 Essential (primary) hypertension: Secondary | ICD-10-CM

## 2023-10-18 DIAGNOSIS — F329 Major depressive disorder, single episode, unspecified: Secondary | ICD-10-CM

## 2023-10-25 ENCOUNTER — Ambulatory Visit (INDEPENDENT_AMBULATORY_CARE_PROVIDER_SITE_OTHER): Admitting: Internal Medicine

## 2023-10-25 VITALS — BP 126/74 | HR 98 | Temp 98.6°F | Ht 65.0 in | Wt 163.4 lb

## 2023-10-25 DIAGNOSIS — I10 Essential (primary) hypertension: Secondary | ICD-10-CM | POA: Diagnosis not present

## 2023-10-25 DIAGNOSIS — E782 Mixed hyperlipidemia: Secondary | ICD-10-CM | POA: Diagnosis not present

## 2023-10-25 DIAGNOSIS — F329 Major depressive disorder, single episode, unspecified: Secondary | ICD-10-CM

## 2023-10-25 DIAGNOSIS — Z1211 Encounter for screening for malignant neoplasm of colon: Secondary | ICD-10-CM

## 2023-10-25 DIAGNOSIS — K219 Gastro-esophageal reflux disease without esophagitis: Secondary | ICD-10-CM

## 2023-10-25 MED ORDER — DULOXETINE HCL 60 MG PO CPEP: 60.0000 mg | ORAL_CAPSULE | Freq: Every day | ORAL | 0 refills | Status: DC

## 2023-10-25 MED ORDER — PANTOPRAZOLE SODIUM 40 MG PO TBEC: 40.0000 mg | DELAYED_RELEASE_TABLET | Freq: Every day | ORAL | 0 refills | Status: DC

## 2023-10-25 MED ORDER — OLMESARTAN MEDOXOMIL 40 MG PO TABS
40.0000 mg | ORAL_TABLET | Freq: Every day | ORAL | 0 refills | Status: DC
Start: 2023-10-25 — End: 2023-11-25

## 2023-10-25 MED ORDER — CHLORTHALIDONE 25 MG PO TABS: 25.0000 mg | ORAL_TABLET | Freq: Every morning | ORAL | 0 refills | Status: DC

## 2023-10-25 MED ORDER — AMLODIPINE BESYLATE 10 MG PO TABS
10.0000 mg | ORAL_TABLET | Freq: Every day | ORAL | 0 refills | Status: DC
Start: 2023-10-25 — End: 2024-01-27

## 2023-10-25 MED ORDER — TIZANIDINE HCL 4 MG PO TABS: 4.0000 mg | ORAL_TABLET | Freq: Two times a day (BID) | ORAL | 0 refills | Status: DC

## 2023-10-25 MED ORDER — GABAPENTIN 400 MG PO CAPS: 400.0000 mg | ORAL_CAPSULE | Freq: Three times a day (TID) | ORAL | 0 refills | Status: DC
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
## 2023-11-23 ENCOUNTER — Other Ambulatory Visit: Payer: Self-pay | Admitting: Internal Medicine

## 2023-11-23 DIAGNOSIS — I10 Essential (primary) hypertension: Secondary | ICD-10-CM

## 2023-11-29 ENCOUNTER — Other Ambulatory Visit: Payer: Self-pay | Admitting: Internal Medicine

## 2023-11-29 DIAGNOSIS — I10 Essential (primary) hypertension: Secondary | ICD-10-CM

## 2024-01-21 ENCOUNTER — Other Ambulatory Visit: Payer: Self-pay | Admitting: Internal Medicine

## 2024-01-22 ENCOUNTER — Other Ambulatory Visit: Payer: Self-pay | Admitting: Internal Medicine

## 2024-01-22 DIAGNOSIS — G43E09 Chronic migraine with aura, not intractable, without status migrainosus: Secondary | ICD-10-CM

## 2024-01-23 ENCOUNTER — Other Ambulatory Visit: Payer: Self-pay

## 2024-01-23 ENCOUNTER — Encounter: Payer: Self-pay | Admitting: Internal Medicine

## 2024-01-23 DIAGNOSIS — G43E09 Chronic migraine with aura, not intractable, without status migrainosus: Secondary | ICD-10-CM

## 2024-01-23 MED ORDER — RIZATRIPTAN BENZOATE 5 MG PO TBDP
ORAL_TABLET | ORAL | 2 refills | Status: AC
Start: 2024-01-23 — End: ?

## 2024-01-26 ENCOUNTER — Other Ambulatory Visit: Payer: Self-pay | Admitting: Internal Medicine

## 2024-01-26 DIAGNOSIS — I10 Essential (primary) hypertension: Secondary | ICD-10-CM

## 2024-01-28 ENCOUNTER — Other Ambulatory Visit: Payer: Self-pay | Admitting: Internal Medicine

## 2024-01-28 ENCOUNTER — Encounter: Payer: Self-pay | Admitting: Internal Medicine

## 2024-01-28 DIAGNOSIS — K219 Gastro-esophageal reflux disease without esophagitis: Secondary | ICD-10-CM

## 2024-01-31 ENCOUNTER — Ambulatory Visit: Admitting: Internal Medicine

## 2024-02-10 ENCOUNTER — Ambulatory Visit: Admitting: Internal Medicine

## 2024-02-17 ENCOUNTER — Ambulatory Visit: Admitting: Internal Medicine

## 2024-02-24 ENCOUNTER — Other Ambulatory Visit: Payer: Self-pay | Admitting: Internal Medicine

## 2024-02-24 DIAGNOSIS — I10 Essential (primary) hypertension: Secondary | ICD-10-CM

## 2024-02-24 DIAGNOSIS — F329 Major depressive disorder, single episode, unspecified: Secondary | ICD-10-CM

## 2024-02-25 ENCOUNTER — Other Ambulatory Visit: Payer: Self-pay | Admitting: Internal Medicine

## 2024-02-25 DIAGNOSIS — I10 Essential (primary) hypertension: Secondary | ICD-10-CM

## 2024-02-26 ENCOUNTER — Other Ambulatory Visit: Payer: Self-pay | Admitting: Internal Medicine

## 2024-02-26 DIAGNOSIS — K219 Gastro-esophageal reflux disease without esophagitis: Secondary | ICD-10-CM

## 2024-03-03 ENCOUNTER — Ambulatory Visit: Admitting: Internal Medicine

## 2024-03-04 ENCOUNTER — Encounter: Payer: Self-pay | Admitting: Internal Medicine

## 2024-03-17 ENCOUNTER — Encounter: Payer: Self-pay | Admitting: Internal Medicine

## 2024-03-17 ENCOUNTER — Ambulatory Visit: Admitting: Internal Medicine

## 2024-04-02 ENCOUNTER — Other Ambulatory Visit: Payer: Self-pay | Admitting: Internal Medicine

## 2024-04-02 DIAGNOSIS — K219 Gastro-esophageal reflux disease without esophagitis: Secondary | ICD-10-CM

## 2024-04-03 ENCOUNTER — Other Ambulatory Visit: Payer: Self-pay | Admitting: Internal Medicine

## 2024-04-03 DIAGNOSIS — K219 Gastro-esophageal reflux disease without esophagitis: Secondary | ICD-10-CM

## 2024-04-07 ENCOUNTER — Other Ambulatory Visit: Payer: Self-pay | Admitting: Internal Medicine

## 2024-04-07 DIAGNOSIS — E782 Mixed hyperlipidemia: Secondary | ICD-10-CM

## 2024-04-26 ENCOUNTER — Other Ambulatory Visit: Payer: Self-pay | Admitting: Internal Medicine

## 2024-04-26 DIAGNOSIS — I10 Essential (primary) hypertension: Secondary | ICD-10-CM

## 2024-04-27 ENCOUNTER — Other Ambulatory Visit: Payer: Self-pay | Admitting: Internal Medicine

## 2024-04-27 DIAGNOSIS — I10 Essential (primary) hypertension: Secondary | ICD-10-CM

## 2024-04-28 ENCOUNTER — Other Ambulatory Visit: Payer: Self-pay | Admitting: Internal Medicine

## 2024-04-28 DIAGNOSIS — I10 Essential (primary) hypertension: Secondary | ICD-10-CM

## 2024-05-26 ENCOUNTER — Other Ambulatory Visit: Payer: Self-pay | Admitting: Internal Medicine

## 2024-05-26 DIAGNOSIS — I10 Essential (primary) hypertension: Secondary | ICD-10-CM

## 2024-05-27 ENCOUNTER — Other Ambulatory Visit: Payer: Self-pay | Admitting: Internal Medicine

## 2024-05-27 DIAGNOSIS — I10 Essential (primary) hypertension: Secondary | ICD-10-CM

## 2024-05-29 ENCOUNTER — Other Ambulatory Visit: Payer: Self-pay | Admitting: Internal Medicine

## 2024-05-29 DIAGNOSIS — I10 Essential (primary) hypertension: Secondary | ICD-10-CM

## 2024-05-30 ENCOUNTER — Other Ambulatory Visit: Payer: Self-pay | Admitting: Internal Medicine

## 2024-05-30 DIAGNOSIS — F329 Major depressive disorder, single episode, unspecified: Secondary | ICD-10-CM

## 2024-05-31 ENCOUNTER — Other Ambulatory Visit: Payer: Self-pay | Admitting: Internal Medicine

## 2024-05-31 DIAGNOSIS — I10 Essential (primary) hypertension: Secondary | ICD-10-CM

## 2024-06-06 ENCOUNTER — Other Ambulatory Visit: Payer: Self-pay | Admitting: Internal Medicine

## 2024-06-06 DIAGNOSIS — K219 Gastro-esophageal reflux disease without esophagitis: Secondary | ICD-10-CM

## 2024-06-08 ENCOUNTER — Encounter: Payer: Self-pay | Admitting: Internal Medicine

## 2024-06-08 ENCOUNTER — Ambulatory Visit: Admitting: Internal Medicine

## 2024-06-08 DIAGNOSIS — F329 Major depressive disorder, single episode, unspecified: Secondary | ICD-10-CM | POA: Diagnosis not present

## 2024-06-08 DIAGNOSIS — G43E09 Chronic migraine with aura, not intractable, without status migrainosus: Secondary | ICD-10-CM

## 2024-06-08 DIAGNOSIS — K219 Gastro-esophageal reflux disease without esophagitis: Secondary | ICD-10-CM | POA: Diagnosis not present

## 2024-06-08 DIAGNOSIS — I10 Essential (primary) hypertension: Secondary | ICD-10-CM | POA: Diagnosis not present

## 2024-06-08 DIAGNOSIS — E782 Mixed hyperlipidemia: Secondary | ICD-10-CM | POA: Diagnosis not present

## 2024-06-08 MED ORDER — AMLODIPINE BESYLATE 10 MG PO TABS: 10.0000 mg | ORAL_TABLET | Freq: Every day | ORAL | 0 refills | Status: AC

## 2024-06-08 MED ORDER — PANTOPRAZOLE SODIUM 40 MG PO TBEC: 40.0000 mg | DELAYED_RELEASE_TABLET | Freq: Every day | ORAL | 0 refills | Status: AC

## 2024-06-08 MED ORDER — DULOXETINE HCL 60 MG PO CPEP: 60.0000 mg | ORAL_CAPSULE | Freq: Every day | ORAL | 0 refills | Status: AC

## 2024-06-08 MED ORDER — CHLORTHALIDONE 25 MG PO TABS: 25.0000 mg | ORAL_TABLET | Freq: Every day | ORAL | 0 refills | Status: AC

## 2024-06-08 MED ORDER — TIZANIDINE HCL 4 MG PO TABS: 4.0000 mg | ORAL_TABLET | Freq: Two times a day (BID) | ORAL | 2 refills | Status: AC | PRN

## 2024-06-08 MED ORDER — GABAPENTIN 400 MG PO CAPS: 400.0000 mg | ORAL_CAPSULE | Freq: Three times a day (TID) | ORAL | 0 refills | Status: AC

## 2024-06-08 MED ORDER — OLMESARTAN MEDOXOMIL 40 MG PO TABS: 40.0000 mg | ORAL_TABLET | Freq: Every day | ORAL | 0 refills | Status: AC

## 2024-06-08 MED ORDER — RIZATRIPTAN BENZOATE 5 MG PO TBDP: ORAL_TABLET | ORAL | 2 refills | Status: AC
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
## 2024-06-09 LAB — CBC WITH DIFF/PLATELET
Basophils Absolute: 0 x10E3/uL (ref 0.0–0.2)
Basos: 1 %
EOS (ABSOLUTE): 0.2 x10E3/uL (ref 0.0–0.4)
Eos: 5 %
Hematocrit: 34.7 % (ref 34.0–46.6)
Hemoglobin: 11.3 g/dL (ref 11.1–15.9)
Immature Grans (Abs): 0 x10E3/uL (ref 0.0–0.1)
Immature Granulocytes: 0 %
Lymphocytes Absolute: 1.8 x10E3/uL (ref 0.7–3.1)
Lymphs: 49 %
MCH: 29.4 pg (ref 26.6–33.0)
MCHC: 32.6 g/dL (ref 31.5–35.7)
MCV: 90 fL (ref 79–97)
Monocytes Absolute: 0.4 x10E3/uL (ref 0.1–0.9)
Monocytes: 11 %
Neutrophils Absolute: 1.2 x10E3/uL — ABNORMAL LOW (ref 1.4–7.0)
Neutrophils: 34 %
Platelets: 218 x10E3/uL (ref 150–450)
RBC: 3.84 x10E6/uL (ref 3.77–5.28)
RDW: 13.2 % (ref 11.7–15.4)
WBC: 3.7 x10E3/uL (ref 3.4–10.8)

## 2024-06-09 LAB — LIPID PANEL
Chol/HDL Ratio: 3.1 ratio (ref 0.0–4.4)
Cholesterol, Total: 172 mg/dL (ref 100–199)
HDL: 56 mg/dL (ref >39)
LDL Chol Calc (NIH): 94 mg/dL (ref 0–99)
Triglycerides: 123 mg/dL (ref 0–149)
VLDL Cholesterol Cal: 22 mg/dL (ref 5–40)

## 2024-06-09 LAB — COMPREHENSIVE METABOLIC PANEL WITH GFR
ALT: 20 IU/L (ref 0–32)
AST: 17 IU/L (ref 0–40)
Albumin: 4.6 g/dL (ref 3.8–4.9)
Alkaline Phosphatase: 108 IU/L (ref 49–135)
BUN/Creatinine Ratio: 24 — ABNORMAL HIGH (ref 9–23)
BUN: 27 mg/dL — ABNORMAL HIGH (ref 6–24)
Bilirubin Total: 0.3 mg/dL (ref 0.0–1.2)
CO2: 25 mmol/L (ref 20–29)
Calcium: 10.1 mg/dL (ref 8.7–10.2)
Chloride: 97 mmol/L (ref 96–106)
Creatinine, Ser: 1.13 mg/dL — ABNORMAL HIGH (ref 0.57–1.00)
Globulin, Total: 2.8 g/dL (ref 1.5–4.5)
Glucose: 93 mg/dL (ref 70–99)
Potassium: 4.3 mmol/L (ref 3.5–5.2)
Sodium: 135 mmol/L (ref 134–144)
Total Protein: 7.4 g/dL (ref 6.0–8.5)
eGFR: 57 mL/min/1.73 — ABNORMAL LOW (ref >59)

## 2024-06-09 LAB — TSH: TSH: 2.62 u[IU]/mL (ref 0.450–4.500)

## 2024-06-10 ENCOUNTER — Encounter: Payer: Self-pay | Admitting: Internal Medicine

## 2024-06-19 ENCOUNTER — Encounter: Payer: Self-pay | Admitting: Internal Medicine

## 2024-06-19 ENCOUNTER — Ambulatory Visit: Admitting: Internal Medicine

## 2024-06-22 ENCOUNTER — Ambulatory Visit: Admitting: Internal Medicine

## 2024-06-22 ENCOUNTER — Encounter: Payer: Self-pay | Admitting: Internal Medicine

## 2024-06-22 ENCOUNTER — Ambulatory Visit: Payer: Self-pay | Admitting: Internal Medicine

## 2024-06-22 VITALS — BP 122/83 | HR 93 | Temp 98.3°F | Ht 65.0 in | Wt 172.4 lb

## 2024-06-22 DIAGNOSIS — K219 Gastro-esophageal reflux disease without esophagitis: Secondary | ICD-10-CM

## 2024-06-22 DIAGNOSIS — E782 Mixed hyperlipidemia: Secondary | ICD-10-CM

## 2024-06-22 DIAGNOSIS — R0683 Snoring: Secondary | ICD-10-CM

## 2024-06-22 DIAGNOSIS — N1831 Chronic kidney disease, stage 3a: Secondary | ICD-10-CM | POA: Diagnosis not present

## 2024-06-22 DIAGNOSIS — I10 Essential (primary) hypertension: Secondary | ICD-10-CM
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
## 2024-07-08 ENCOUNTER — Ambulatory Visit: Admitting: Internal Medicine

## 2024-07-29 NOTE — Progress Notes (Signed)
 Melanie Reid                                          MRN: [id number]   07/29/2024   The [facility] Quality Team Specialist reviewed this patient medical record for the purposes of chart review for care gap closure. The following were reviewed: chart review for care gap closure-breast cancer screening.    [facility] Quality Team

## 2024-09-07 ENCOUNTER — Ambulatory Visit: Admitting: Internal Medicine
